# Patient Record
Sex: Male | Born: 1955 | Race: White | Hispanic: No | Marital: Married | Smoking: Never smoker
Health system: Southern US, Community
[De-identification: ages and names within clinical notes are randomized; demographics above are authoritative.]

## PROBLEM LIST (undated history)

## (undated) DIAGNOSIS — C4490 Unspecified malignant neoplasm of skin, unspecified: Secondary | ICD-10-CM

## (undated) DIAGNOSIS — D472 Monoclonal gammopathy: Secondary | ICD-10-CM

## (undated) DIAGNOSIS — R001 Bradycardia, unspecified: Secondary | ICD-10-CM

## (undated) DIAGNOSIS — J189 Pneumonia, unspecified organism: Secondary | ICD-10-CM

## (undated) DIAGNOSIS — E785 Hyperlipidemia, unspecified: Secondary | ICD-10-CM

## (undated) DIAGNOSIS — M1990 Unspecified osteoarthritis, unspecified site: Secondary | ICD-10-CM

## (undated) HISTORY — PX: WISDOM TOOTH EXTRACTION: SHX21

## (undated) HISTORY — PX: CHOLECYSTECTOMY: SHX55

## (undated) HISTORY — PX: ROTATOR CUFF REPAIR: SHX139

## (undated) HISTORY — PX: NASAL SEPTUM SURGERY: SHX37

## (undated) HISTORY — PX: MOHS SURGERY: SUR867

---

## 2012-04-27 ENCOUNTER — Emergency Department (HOSPITAL_BASED_OUTPATIENT_CLINIC_OR_DEPARTMENT_OTHER)
Admission: EM | Admit: 2012-04-27 | Discharge: 2012-04-27 | Disposition: A | Discharge status: Home / Self Care | Payer: BC Managed Care – PPO | Attending: Emergency Medicine | Admitting: Emergency Medicine

## 2012-04-27 ENCOUNTER — Encounter (HOSPITAL_BASED_OUTPATIENT_CLINIC_OR_DEPARTMENT_OTHER): Payer: Self-pay | Admitting: *Deleted

## 2012-04-27 DIAGNOSIS — H6690 Otitis media, unspecified, unspecified ear: Secondary | ICD-10-CM | POA: Insufficient documentation

## 2012-04-27 DIAGNOSIS — R52 Pain, unspecified: Secondary | ICD-10-CM | POA: Insufficient documentation

## 2012-04-27 DIAGNOSIS — H60399 Other infective otitis externa, unspecified ear: Secondary | ICD-10-CM | POA: Insufficient documentation

## 2012-04-27 DIAGNOSIS — R059 Cough, unspecified: Secondary | ICD-10-CM | POA: Insufficient documentation

## 2012-04-27 DIAGNOSIS — J069 Acute upper respiratory infection, unspecified: Secondary | ICD-10-CM

## 2012-04-27 DIAGNOSIS — H6090 Unspecified otitis externa, unspecified ear: Secondary | ICD-10-CM

## 2012-04-27 DIAGNOSIS — J029 Acute pharyngitis, unspecified: Secondary | ICD-10-CM | POA: Insufficient documentation

## 2012-04-27 DIAGNOSIS — R05 Cough: Secondary | ICD-10-CM | POA: Insufficient documentation

## 2012-04-27 DIAGNOSIS — Z7982 Long term (current) use of aspirin: Secondary | ICD-10-CM | POA: Insufficient documentation

## 2012-04-27 DIAGNOSIS — J3489 Other specified disorders of nose and nasal sinuses: Secondary | ICD-10-CM | POA: Insufficient documentation

## 2012-04-27 DIAGNOSIS — R6883 Chills (without fever): Secondary | ICD-10-CM | POA: Insufficient documentation

## 2012-04-27 DIAGNOSIS — Z79899 Other long term (current) drug therapy: Secondary | ICD-10-CM | POA: Insufficient documentation

## 2012-04-27 MED ORDER — OXYCODONE-ACETAMINOPHEN 5-325 MG PO TABS: 1.0000 | ORAL_TABLET | Freq: Four times a day (QID) | ORAL | Status: DC | PRN

## 2012-04-27 MED ORDER — AZITHROMYCIN 250 MG PO TABS
500.0000 mg | ORAL_TABLET | Freq: Once | ORAL | Status: AC
  Administered 2012-04-27: 500 mg via ORAL
  Filled 2012-04-27: qty 2

## 2012-04-27 MED ORDER — AZITHROMYCIN 250 MG PO TABS: 250.0000 mg | ORAL_TABLET | Freq: Every day | ORAL | Status: DC

## 2012-04-27 MED ORDER — CIPROFLOXACIN-DEXAMETHASONE 0.3-0.1 % OT SUSP
4.0000 [drp] | Freq: Two times a day (BID) | OTIC | Status: DC
  Administered 2012-04-27: 4 [drp] via OTIC
  Filled 2012-04-27: qty 7.5

## 2012-04-27 MED ORDER — OXYCODONE-ACETAMINOPHEN 5-325 MG PO TABS
2.0000 | ORAL_TABLET | Freq: Once | ORAL | Status: AC
  Administered 2012-04-27: 2 via ORAL
  Filled 2012-04-27 (×2): qty 2

## 2012-04-27 NOTE — ED Provider Notes (Signed)
History  This chart was scribed for Charles B. Bernette Mayers, MD by Shari Heritage, ED Scribe. The patient was seen in room MH01/MH01. Patient's care was started at 2138.   CSN: [id number]  Arrival date & time 04/27/12  1927   First MD Initiated Contact with Patient 04/27/12 2138      Chief Complaint  Patient presents with  . Otalgia    The history is provided by the patient. No language interpreter was used.    HPI Comments: John Thomas is a 57 y.o. male who presents to the Emergency Department complaining of sudden, moderate, constant, non-radiating right otalgia onset early this morning. There is associated chills and body aches. Patient has been traveling in [place] for the past several days. He states that pain began before his flight home to the [place] and persisted throughout. He states that on his connecting flight from [facility] to [place], he developed chills and body aches. Patient has been taking ibuprofen and states that it relieves pain significantly, but his last dosage was 6-8 hours ago. Patient also reports that he has had some cold symptoms for the past week including sore throat, cough and nasal congestion. He states that sore throat and congestion have improved, but cough has been persistent since Monday. Patient reports no significant past medical history.   History reviewed. No pertinent past medical history.  Past Surgical History  Procedure Laterality Date  . Cholecystectomy      History reviewed. No pertinent family history.  History  Substance Use Topics  . Smoking status: Never Smoker   . Smokeless tobacco: Not on file  . Alcohol Use: Yes      Review of Systems A complete 10 system review of systems was obtained and all systems are negative except as noted in the HPI and PMH.   Allergies  Penicillins  Home Medications   Current Outpatient Rx  Name  Route  Sig  Dispense  Refill  . aspirin 81 MG tablet   Oral   Take 81 mg by mouth daily.          Marland Kitchen atorvastatin (LIPITOR) 10 MG tablet   Oral   Take 10 mg by mouth daily.           Triage Vitals: BP 150/74  Pulse 84  Temp(Src) 98.9 F (37.2 C) (Oral)  Resp 20  Ht 6\' 3"  (1.905 m)  Wt 215 lb (97.523 kg)  BMI 26.87 kg/m2  SpO2 95%  Physical Exam  Nursing note and vitals reviewed. Constitutional: He is oriented to person, place, and time. He appears well-developed and well-nourished.  HENT:  Head: Normocephalic and atraumatic.  Moderate otitis externa on the left. No definite TM perforation.   Eyes: EOM are normal. Pupils are equal, round, and reactive to light.  Neck: Normal range of motion. Neck supple.  Cardiovascular: Normal rate, normal heart sounds and intact distal pulses.   Pulmonary/Chest: Effort normal and breath sounds normal.  Abdominal: Bowel sounds are normal. He exhibits no distension. There is no tenderness.  Musculoskeletal: Normal range of motion. He exhibits no edema and no tenderness.  Neurological: He is alert and oriented to person, place, and time. He has normal strength. No cranial nerve deficit or sensory deficit.  Skin: Skin is warm and dry. No rash noted.  Psychiatric: He has a normal mood and affect.    ED Course  Procedures (including critical care time) DIAGNOSTIC STUDIES: Oxygen Saturation is 95% on room air, adequate by my interpretation.  COORDINATION OF CARE: 9:45 PM- Patient informed of current plan for treatment and evaluation and agrees with plan at this time.      Labs Reviewed - No data to display No results found.   No diagnosis found.    MDM  Otitis externa with probable otitis media. Also having URI symptoms and febrile here. Will give topical and oral Abx. Pain meds and antipyretics at home as needed.       I personally performed the services described in this documentation, which was scribed in my presence. The recorded information has been reviewed and is accurate.     Charles B. Bernette Mayers,  MD 04/27/12 2157

## 2012-04-27 NOTE — ED Notes (Signed)
Right ear pain since this a.m. Hx cold s/s. Trying OTC meds with some relief. Recent hx of flying.

## 2018-02-11 ENCOUNTER — Encounter: Payer: Self-pay | Admitting: Family Medicine

## 2018-02-11 ENCOUNTER — Ambulatory Visit (INDEPENDENT_AMBULATORY_CARE_PROVIDER_SITE_OTHER): Payer: BLUE CROSS/BLUE SHIELD | Admitting: Family Medicine

## 2018-02-11 VITALS — BP 166/72 | HR 48 | Ht 75.0 in | Wt 210.0 lb

## 2018-02-11 DIAGNOSIS — M6788 Other specified disorders of synovium and tendon, other site: Secondary | ICD-10-CM | POA: Diagnosis not present

## 2018-02-11 DIAGNOSIS — M7701 Medial epicondylitis, right elbow: Secondary | ICD-10-CM

## 2018-02-11 MED ORDER — NITROGLYCERIN 0.2 MG/HR TD PT24: MEDICATED_PATCH | TRANSDERMAL | 1 refills | Status: DC

## 2018-02-11 NOTE — Progress Notes (Signed)
PCP: No primary care provider on file.  Subjective:   HPI: Patient is a 62 y.o. male here for left Achilles pain and right elbow pain.  Patient reports left Achilles pain for the past 3 to 4 years.  Is currently 0/10 but can be 7/10 at times and sharp.  He denies any specific mechanism of injury.  Prior to injury he was a regular runner which he has since stopped.  Currently he plays racquetball frequently.  He is tried multiple interventions to help his pain: - He was seen at [facility] regarding surgery.  Surgeries none performed - Physical therapy was completed earlier this year with minimal benefit; doing home exercises - Podiatry provided him with heel lifts and a steroid injection which did not help. - Chiropractor treated his cavus addressing trigger points which was limited in benefit - Ibuprofen and Voltaren gel has been temporarily helpful - He also uses ice and elevation - Currently his pain is worse in in the morning or with driving.  He describes an aching pain.  His pain is also worse following racquetball however his Achilles pain does not prevent him from playing  His right elbow pain has been present for 2 months.  It is currently 0/10.  Is over the medial aspect of the right elbow and is worse during weight lifting or while playing racquetball.  No past medical history on file.  Current Outpatient Medications on File Prior to Visit  Medication Sig Dispense Refill  . aspirin 81 MG tablet Take 81 mg by mouth daily.    Marland Kitchen atorvastatin (LIPITOR) 10 MG tablet Take 10 mg by mouth daily.    Marland Kitchen azithromycin (ZITHROMAX) 250 MG tablet Take 1 tablet (250 mg total) by mouth daily. 4 tablet 0  . oxyCODONE-acetaminophen (PERCOCET/ROXICET) 5-325 MG per tablet Take 1-2 tablets by mouth every 6 (six) hours as needed for pain. 20 tablet 0   No current facility-administered medications on file prior to visit.     Past Surgical History:  Procedure Laterality Date  .  CHOLECYSTECTOMY      Allergies  Allergen Reactions  . Penicillins Other (See Comments)    As child    Social History   Socioeconomic History  . Marital status: Married    Spouse name: Not on file  . Number of children: Not on file  . Years of education: Not on file  . Highest education level: Not on file  Occupational History  . Not on file  Social Needs  . Financial resource strain: Not on file  . Food insecurity:    Worry: Not on file    Inability: Not on file  . Transportation needs:    Medical: Not on file    Non-medical: Not on file  Tobacco Use  . Smoking status: Never Smoker  . Smokeless tobacco: Never Used  Substance and Sexual Activity  . Alcohol use: Yes  . Drug use: No  . Sexual activity: Not on file  Lifestyle  . Physical activity:    Days per week: Not on file    Minutes per session: Not on file  . Stress: Not on file  Relationships  . Social connections:    Talks on phone: Not on file    Gets together: Not on file    Attends religious service: Not on file    Active member of club or organization: Not on file    Attends meetings of clubs or organizations: Not on file  Relationship status: Not on file  . Intimate partner violence:    Fear of current or ex partner: Not on file    Emotionally abused: Not on file    Physically abused: Not on file    Forced sexual activity: Not on file  Other Topics Concern  . Not on file  Social History Narrative  . Not on file    No family history on file.  BP (!) 166/72   Pulse (!) 48   Ht 6\' 3"  (1.905 m)   Wt 210 lb (95.3 kg)   BMI 26.25 kg/m   Review of Systems: See HPI above.     Objective:  Physical Exam:  Gen: awake, alert, NAD, comfortable in exam room Pulm: breathing unlabored  Left ankle: - Inspection: No obvious deformity, erythema, swelling, or ecchymosis - Palpation: Tenderness at the insertion of the Achilles on the calcaneus - Strength: Normal strength with dorsiflexion,  plantarflexion, inversion, and eversion - ROM: Full ROM - Neuro/vasc: NV intact  Right elbow: No obvious deformity, erythema, swelling Mild tenderness over the medial epicondyle Full range of motion of the elbow and wrist 5/5 strength in wrist flexion extension.  Pain reproduced with resisted wrist flexion N/V intact distally    Assessment & Plan:  1.  Left Achilles tendinopathy/enthesopathy- patient has trialed multiple interventions at this point with minimal benefit.  He does reportedly have an appointment tomorrow to discuss PRP versus stem cell treatment. -We will start on nitroglycerin patches today -Heel lift -He will continue home exercises -We will follow up in 6 weeks.  2.  Medial epicondylitis -Voltaren gel -Home exercises -Consider counterforce brace -Follow-up in 6 weeks

## 2018-02-11 NOTE — Patient Instructions (Signed)
You have Achilles Tendinopathy Calf raises 3 sets of 10 on level ground once a day first. When these are easy, can do them one legged 3 sets of 10. Finally advance to doing them on a step. Can add heel walks, toe walks forward and backward as well Icing 15 minutes at a time 3-4 times a day as needed. Aleve or ibuprofen if needed. Avoid uneven ground, hills as much as possible. Heel lifts in shoes or shoes with a natural heel lift. Nitro patches 1/4th patch to affected area, change daily - can increase to 1/2 patch if you're not getting headaches. Consider period of immobilization, custom orthotics, PRP if not improving as expected. Follow up in 6 weeks.  You have medial epicondylitis (golfer's elbow) Avoid painful activities as much as possible (unless doing home exercises). Voltaren gel topically up to 4 times a day as needed. Strengthening with 1 pound weight pronation/supination, wrist flexion, stretching exercises (hold stretches 20-30 seconds and repeat 3 times, exercises 3 sets of 10 of each). Counterforce brace may be helpful to unload area of pain while it heals. Consider formal physical therapy, nitro patches, injection if not improving with home exercises. Follow up with me in 6 weeks.

## 2018-03-25 ENCOUNTER — Ambulatory Visit: Payer: BLUE CROSS/BLUE SHIELD | Admitting: Family Medicine

## 2018-07-26 ENCOUNTER — Other Ambulatory Visit: Payer: Self-pay | Admitting: Family Medicine

## 2019-06-24 ENCOUNTER — Ambulatory Visit
Admission: RE | Admit: 2019-06-24 | Discharge: 2019-06-24 | Disposition: A | Discharge status: Home / Self Care | Payer: BC Managed Care – PPO | Source: Ambulatory Visit | Attending: Family Medicine | Admitting: Family Medicine

## 2019-06-24 ENCOUNTER — Other Ambulatory Visit: Payer: Self-pay | Admitting: Family Medicine

## 2019-06-24 ENCOUNTER — Other Ambulatory Visit: Payer: Self-pay

## 2019-06-24 ENCOUNTER — Encounter: Payer: Self-pay | Admitting: Family Medicine

## 2019-06-24 ENCOUNTER — Ambulatory Visit (INDEPENDENT_AMBULATORY_CARE_PROVIDER_SITE_OTHER): Payer: BC Managed Care – PPO | Admitting: Family Medicine

## 2019-06-24 VITALS — BP 142/70 | Ht 75.0 in | Wt 205.0 lb

## 2019-06-24 DIAGNOSIS — M6788 Other specified disorders of synovium and tendon, other site: Secondary | ICD-10-CM

## 2019-06-24 DIAGNOSIS — M542 Cervicalgia: Secondary | ICD-10-CM

## 2019-06-24 MED ORDER — NITROGLYCERIN 0.2 MG/HR TD PT24: MEDICATED_PATCH | TRANSDERMAL | 1 refills | Status: DC

## 2019-06-24 NOTE — Progress Notes (Signed)
PCP: Jeni Salles, MD  Subjective:   HPI: Patient is a 64 y.o. male here for left achilles, left neck pain.  02/11/18: Patient reports left Achilles pain for the past 3 to 4 years.  Is currently 0/10 but can be 7/10 at times and sharp.  He denies any specific mechanism of injury.  Prior to injury he was a regular runner which he has since stopped.  Currently he plays racquetball frequently.  He is tried multiple interventions to help his pain: - He was seen at [facility] regarding surgery.  Surgeries none performed - Physical therapy was completed earlier this year with minimal benefit; doing home exercises - Podiatry provided him with heel lifts and a steroid injection which did not help. - Chiropractor treated his cavus addressing trigger points which was limited in benefit - Ibuprofen and Voltaren gel has been temporarily helpful - He also uses ice and elevation - Currently his pain is worse in in the morning or with driving.  He describes an aching pain.  His pain is also worse following racquetball however his Achilles pain does not prevent him from playing  06/24/19: Patient reports he has continued to have issues with his left achilles though pain in his right elbow, low back has improved. He reports since last visit he used nitro patches for about 1-2 months but didn't notice much benefit at 1/4th patch - unsure why he stopped it but denies headaches. He did have one injection of PRP for achilles. Has done PT, home exercise program and continues to do home exercises. Recalls pain was worse especially after playing racquetball for 2.5 hours on Saturday. Pain was a soreness and with burning.  Left neck pain has been going on for about a year. Feels deeper left side of mid-neck. Worse with flexion and turning to right side. No numbness or tingling. No radiation into arms. Has done home exercises and had treatment with chiro Johnston Ebbs.  History  reviewed. No pertinent past medical history.  Current Outpatient Medications on File Prior to Visit  Medication Sig Dispense Refill  . gabapentin (NEURONTIN) 300 MG capsule TK 2 CS PO BID  10  . pramipexole (MIRAPEX) 0.125 MG tablet TK 1 T PO QPM  0  . rosuvastatin (CRESTOR) 10 MG tablet Take 10 mg by mouth daily.    . diclofenac sodium (VOLTAREN) 1 % GEL APP 4 G TO BOTH FEET TID PRN  6  . zolpidem (AMBIEN) 10 MG tablet Take 5-10 mg by mouth at bedtime as needed.     No current facility-administered medications on file prior to visit.    Past Surgical History:  Procedure Laterality Date  . CHOLECYSTECTOMY      Allergies  Allergen Reactions  . Penicillins Other (See Comments)    As child    Social History   Socioeconomic History  . Marital status: Married    Spouse name: Not on file  . Number of children: Not on file  . Years of education: Not on file  . Highest education level: Not on file  Occupational History  . Not on file  Tobacco Use  . Smoking status: Never Smoker  . Smokeless tobacco: Never Used  Substance and Sexual Activity  . Alcohol use: Yes  . Drug use: No  . Sexual activity: Not on file  Other Topics Concern  . Not on file  Social History Narrative  . Not on file   Social Determinants of Health   Financial Resource  Strain:   . Difficulty of Paying Living Expenses:   Food Insecurity:   . Worried About Charity fundraiser in the Last Year:   . Arboriculturist in the Last Year:   Transportation Needs:   . Film/video editor (Medical):   Marland Kitchen Lack of Transportation (Non-Medical):   Physical Activity:   . Days of Exercise per Week:   . Minutes of Exercise per Session:   Stress:   . Feeling of Stress :   Social Connections:   . Frequency of Communication with Friends and Family:   . Frequency of Social Gatherings with Friends and Family:   . Attends Religious Services:   . Active Member of Clubs or Organizations:   . Attends Theatre manager Meetings:   Marland Kitchen Marital Status:   Intimate Partner Violence:   . Fear of Current or Ex-Partner:   . Emotionally Abused:   Marland Kitchen Physically Abused:   . Sexually Abused:     History reviewed. No pertinent family history.  BP (!) 142/70   Ht 6\' 3"  (1.905 m)   Wt 205 lb (93 kg)   BMI 25.62 kg/m   Review of Systems: See HPI above.     Objective:  Physical Exam:  Gen: NAD, comfortable in exam room  Neck: No gross deformity, swelling, bruising. No paraspinalTTP .  No midline/bony TTP. Extension only to 10 degrees.  Full flexion.  Lateral rotations to 30 degrees, more pain with right lateral rotation. BUE strength 5/5.   Sensation intact to light touch.   2+ equal reflexes in biceps, brachioradialis tendons.  1+ triceps. Negative spurlings. NV intact distal BUEs.  Left foot/ankle: No gross deformity, swelling, ecchymoses FROM with minimal pain (feels it) with single leg raises, plantarflexion TTP lateral achilles insertion at calcaneus.  No other tenderness. Negative ant drawer and talar tilt.   Negative syndesmotic compression. Negative calcaneal squeeze. Thompsons test negative. NV intact distally.   Assessment & Plan:  1. Neck pain - will obtain radiographs.  Suspect facet arthropathy vs deep paraspinal muscle strain.  Consider physical therapy.  2. Left achilles tendinopathy - insertional.  Limited MSK u/s today with disorganization of tendon at insertion with 0.65cm depth of tendon, anechoic regions within tendon but no neovascularity.  Will try 1/2 nitro patches.  Heel lifts when playing racquetball.  Encouraged repeating PRP as well.  F/u in 6 weeks.

## 2019-06-24 NOTE — Patient Instructions (Signed)
Get x-rays of your cervical spine after you leave today - this is due to facet arthritis vs deep paraspinal strain. We will call you with results and next steps.  Continue with your exercises for your achilles. Start nitro patches 1/2 patch to affected achilles, change daily. Wear heel lifts on both sides when playing racquetball to take some tension of the achilles. Consider repeating the PRP over 2 more sessions as well. Follow up with me in 6 weeks for both issues.

## 2019-06-25 ENCOUNTER — Encounter: Payer: Self-pay | Admitting: Family Medicine

## 2019-06-30 NOTE — Telephone Encounter (Signed)
Please make referral to [facility] in [facility] for cervical spine facet arthritis.  He's seen Shanon Brow before.  Thanks!

## 2019-07-03 ENCOUNTER — Ambulatory Visit: Payer: BLUE CROSS/BLUE SHIELD | Admitting: Sports Medicine

## 2019-08-13 ENCOUNTER — Other Ambulatory Visit: Payer: Self-pay

## 2019-08-13 ENCOUNTER — Ambulatory Visit: Payer: BC Managed Care – PPO | Admitting: Family Medicine

## 2019-08-13 ENCOUNTER — Encounter: Payer: Self-pay | Admitting: Family Medicine

## 2019-08-13 VITALS — BP 140/62 | Ht 75.0 in | Wt 207.0 lb

## 2019-08-13 DIAGNOSIS — M542 Cervicalgia: Secondary | ICD-10-CM

## 2019-08-13 DIAGNOSIS — M6788 Other specified disorders of synovium and tendon, other site: Secondary | ICD-10-CM

## 2019-08-13 NOTE — Progress Notes (Signed)
PCP: Jeni Salles, MD  Subjective:   HPI: Patient is a 64 y.o. male here for left achilles, left neck pain.  02/11/18: Patient reports left Achilles pain for the past 3 to 4 years.  Is currently 0/10 but can be 7/10 at times and sharp.  He denies any specific mechanism of injury.  Prior to injury he was a regular runner which he has since stopped.  Currently he plays racquetball frequently.  He is tried multiple interventions to help his pain: - He was seen at [facility] regarding surgery.  Surgeries none performed - Physical therapy was completed earlier this year with minimal benefit; doing home exercises - Podiatry provided him with heel lifts and a steroid injection which did not help. - Chiropractor treated his cavus addressing trigger points which was limited in benefit - Ibuprofen and Voltaren gel has been temporarily helpful - He also uses ice and elevation - Currently his pain is worse in in the morning or with driving.  He describes an aching pain.  His pain is also worse following racquetball however his Achilles pain does not prevent him from playing  06/24/19: Patient reports he has continued to have issues with his left achilles though pain in his right elbow, low back has improved. He reports since last visit he used nitro patches for about 1-2 months but didn't notice much benefit at 1/4th patch - unsure why he stopped it but denies headaches. He did have one injection of PRP for achilles. Has done PT, home exercise program and continues to do home exercises. Recalls pain was worse especially after playing racquetball for 2.5 hours on Saturday. Pain was a soreness and with burning.  Left neck pain has been going on for about a year. Feels deeper left side of mid-neck. Worse with flexion and turning to right side. No numbness or tingling. No radiation into arms. Has done home exercises and had treatment with chiro Johnston Ebbs.  6/2: Patient  reports he is doing well and improving. Able to play racquetball with some stiffness of achilles. Doing physical therapy with Shanon Brow at [facility] and home exercises regularly. Using a full nitro patch left achilles, started glucosamine for neck.. Some pain turning to left side within neck though feels good today. No radiation into extremities. No numbness, tingling.  History reviewed. No pertinent past medical history.  Current Outpatient Medications on File Prior to Visit  Medication Sig Dispense Refill  . diclofenac sodium (VOLTAREN) 1 % GEL APP 4 G TO BOTH FEET TID PRN  6  . gabapentin (NEURONTIN) 300 MG capsule TK 2 CS PO BID  10  . nitroGLYCERIN (NITRODUR - DOSED IN MG/24 HR) 0.2 mg/hr patch UNWRAP AND APPLY 1/2 PATCH TO AFFECTED ACHILLES, CHANGE DAILY 90 patch 0  . pramipexole (MIRAPEX) 0.125 MG tablet TK 1 T PO QPM  0  . rosuvastatin (CRESTOR) 10 MG tablet Take 10 mg by mouth daily.    Marland Kitchen zolpidem (AMBIEN) 10 MG tablet Take 5-10 mg by mouth at bedtime as needed.     No current facility-administered medications on file prior to visit.    Past Surgical History:  Procedure Laterality Date  . CHOLECYSTECTOMY      Allergies  Allergen Reactions  . Penicillins Other (See Comments)    As child    Social History   Socioeconomic History  . Marital status: Married    Spouse name: Not on file  . Number of children: Not on file  . Years of  education: Not on file  . Highest education level: Not on file  Occupational History  . Not on file  Tobacco Use  . Smoking status: Never Smoker  . Smokeless tobacco: Never Used  Substance and Sexual Activity  . Alcohol use: Yes  . Drug use: No  . Sexual activity: Not on file  Other Topics Concern  . Not on file  Social History Narrative  . Not on file   Social Determinants of Health   Financial Resource Strain:   . Difficulty of Paying Living Expenses:   Food Insecurity:   . Worried About Charity fundraiser in the Last Year:    . Arboriculturist in the Last Year:   Transportation Needs:   . Film/video editor (Medical):   Marland Kitchen Lack of Transportation (Non-Medical):   Physical Activity:   . Days of Exercise per Week:   . Minutes of Exercise per Session:   Stress:   . Feeling of Stress :   Social Connections:   . Frequency of Communication with Friends and Family:   . Frequency of Social Gatherings with Friends and Family:   . Attends Religious Services:   . Active Member of Clubs or Organizations:   . Attends Archivist Meetings:   Marland Kitchen Marital Status:   Intimate Partner Violence:   . Fear of Current or Ex-Partner:   . Emotionally Abused:   Marland Kitchen Physically Abused:   . Sexually Abused:     History reviewed. No pertinent family history.  BP 140/62   Ht 6\' 3"  (1.905 m)   Wt 207 lb (93.9 kg)   BMI 25.87 kg/m   Review of Systems: See HPI above.     Objective:  Physical Exam:  Gen: NAD, comfortable in exam room  Neck: No gross deformity, swelling, bruising. No paraspinal TTP .  No midline/bony TTP. Extension to 10 degrees, full lateral rotations and flexion. BUE strength 5/5. Sensation intact to light touch.   Negative spurlings. NV intact distal BUEs.  Left foot/ankle: No gross deformity, swelling, ecchymoses FROM without pain. No TTP NV intact distally.   Assessment & Plan:  1. Neck pain - noted facet arthropathy along with DDD but the latter is more on opposite right side of his primary pain.  Improved with physical therapy - continue with this and transition to home exercises.  2. Left achilles tendinopathy - insertional.  Excellent progress with full nitro patches, physical therapy.  Continue patches for 6 more weeks at least, PT with transition to home exercise program.  F/u in 6 weeks or prn.

## 2019-09-22 ENCOUNTER — Ambulatory Visit
Admission: RE | Admit: 2019-09-22 | Discharge: 2019-09-22 | Disposition: A | Discharge status: Home / Self Care | Payer: BC Managed Care – PPO | Source: Ambulatory Visit | Attending: Family Medicine | Admitting: Family Medicine

## 2019-09-22 ENCOUNTER — Encounter: Payer: Self-pay | Admitting: Family Medicine

## 2019-09-22 ENCOUNTER — Ambulatory Visit: Payer: BC Managed Care – PPO | Admitting: Family Medicine

## 2019-09-22 ENCOUNTER — Other Ambulatory Visit: Payer: Self-pay

## 2019-09-22 VITALS — BP 106/60 | Ht 75.0 in | Wt 207.0 lb

## 2019-09-22 DIAGNOSIS — M79644 Pain in right finger(s): Secondary | ICD-10-CM

## 2019-09-22 NOTE — Patient Instructions (Signed)
Get x-rays of this finger after you leave here - I'll call you with the results.  For your achilles consider a series of PRP as we discussed. Continue your home exercises. The nitro can be continued for 3 more months if you feel you're getting benefit from them - otherwise you can stop this. Shock wave therapy is another consideration. Follow up with me after you do the PRP series.

## 2019-09-22 NOTE — Progress Notes (Addendum)
  John Thomas - 64 y.o. male MRN [id number]  Date of birth: 05/02/1955    SUBJECTIVE:      Chief Complaint:/ HPI:  CC: left achilles tendon pain and right middle finger pain  John Thomas is a 64 year old gentleman presenting for follow up of his achilles tendinopathy and right middle finger pain.   Patient has been evaluated multiple times for his left achilles tendinopathy and has undergone multiple interventions including, PRP therapy and physical therapy, nitro patches. Today, he reports overall improvement and is able to play raquetball, golf, and walk/run. He denies any significant discomfort but does note tightness in the left achilles tendon following activities. He continues to perform physical therapy exercises at home. Not back at the level he would like to be.  Patient also notes right middle finger pain following a traumatic injury eight weeks ago. He notes that he was pressure washing his home when he fell off the ladder and hurt his right middle finger. He sustained superficial laceration to the finger but does not believe he broke anything. No imaging obtained at the time. He notes that he has some pain in the right middle finger that is worse in the morning and improves with activity. He is concerned about possibly developing arthritis.   ROS:     See HPI  PERTINENT  PMH / PSH FH / / SH:  Past Medical, Surgical, Social, and Family History Reviewed & Updated in the EMR.  Pertinent findings include:  No past medical history on file. No family history on file.  OBJECTIVE: BP 106/60   Ht 6\' 3"  (1.905 m)   Wt 207 lb (93.9 kg)   BMI 25.87 kg/m   Physical Exam:  Vital signs are reviewed.  GEN: Alert and oriented, NAD Pulm: Breathing unlabored PSY: normal mood, congruent affect  MSK: Left Foot: Inspection:  No obvious bony deformity.  No swelling, erythema, or bruising.  Normal arch Palpation: No significant tenderness to palpation ROM: Full  ROM of the ankle  with minimal tenderness at the insertion point of achilles tendon; Normal midfoot flexibility Strength: 5/5 strength ankle in all planes Neurovascular: N/V intact distally in the lower extremity Special tests:Normal calcaneal motion with heel raise  Right middle finger: Hand: Inspection: Mild swelling with deformity noted at the right 3rd PIP; no significant erythema or bruising noted.  No malrotation or angulation. Palpation: mild tenderness to palpation of the right 3rd finger PIP ROM: Full ROM of the digits and wrist Strength: 5/5 strength in the forearm, wrist and interosseus muscles including resisted extension and flexion at PIP and DIP joints. Neurovascular: NV intact   ASSESSMENT & PLAN:  1. Left achilles tendinopathy: Patient is mostly functional but still reports some discomfort with stretching and following exercise. Continuing with physical therapy. Discussed repeat PRP therapy vs ECSW therapy. Patient elected to do another course of PRP therapy at this time - will contact the clinic he used previously in [place].   2. Right middle finger pain: Possible fracture vs tendonopathy. Recommended for X-ray. Will call him with results - reports this is about 29 weeks old.

## 2019-09-24 ENCOUNTER — Ambulatory Visit: Payer: BC Managed Care – PPO | Admitting: Family Medicine

## 2021-03-04 IMAGING — CR DG FINGER MIDDLE 2+V*R*
3 series · 3 of 3 positions shown · non-contrast
Comparison: None.

CLINICAL DATA: Pain after fall from ladder

EXAM:
RIGHT THIRD FINGER 2+V

[x finger pa right]
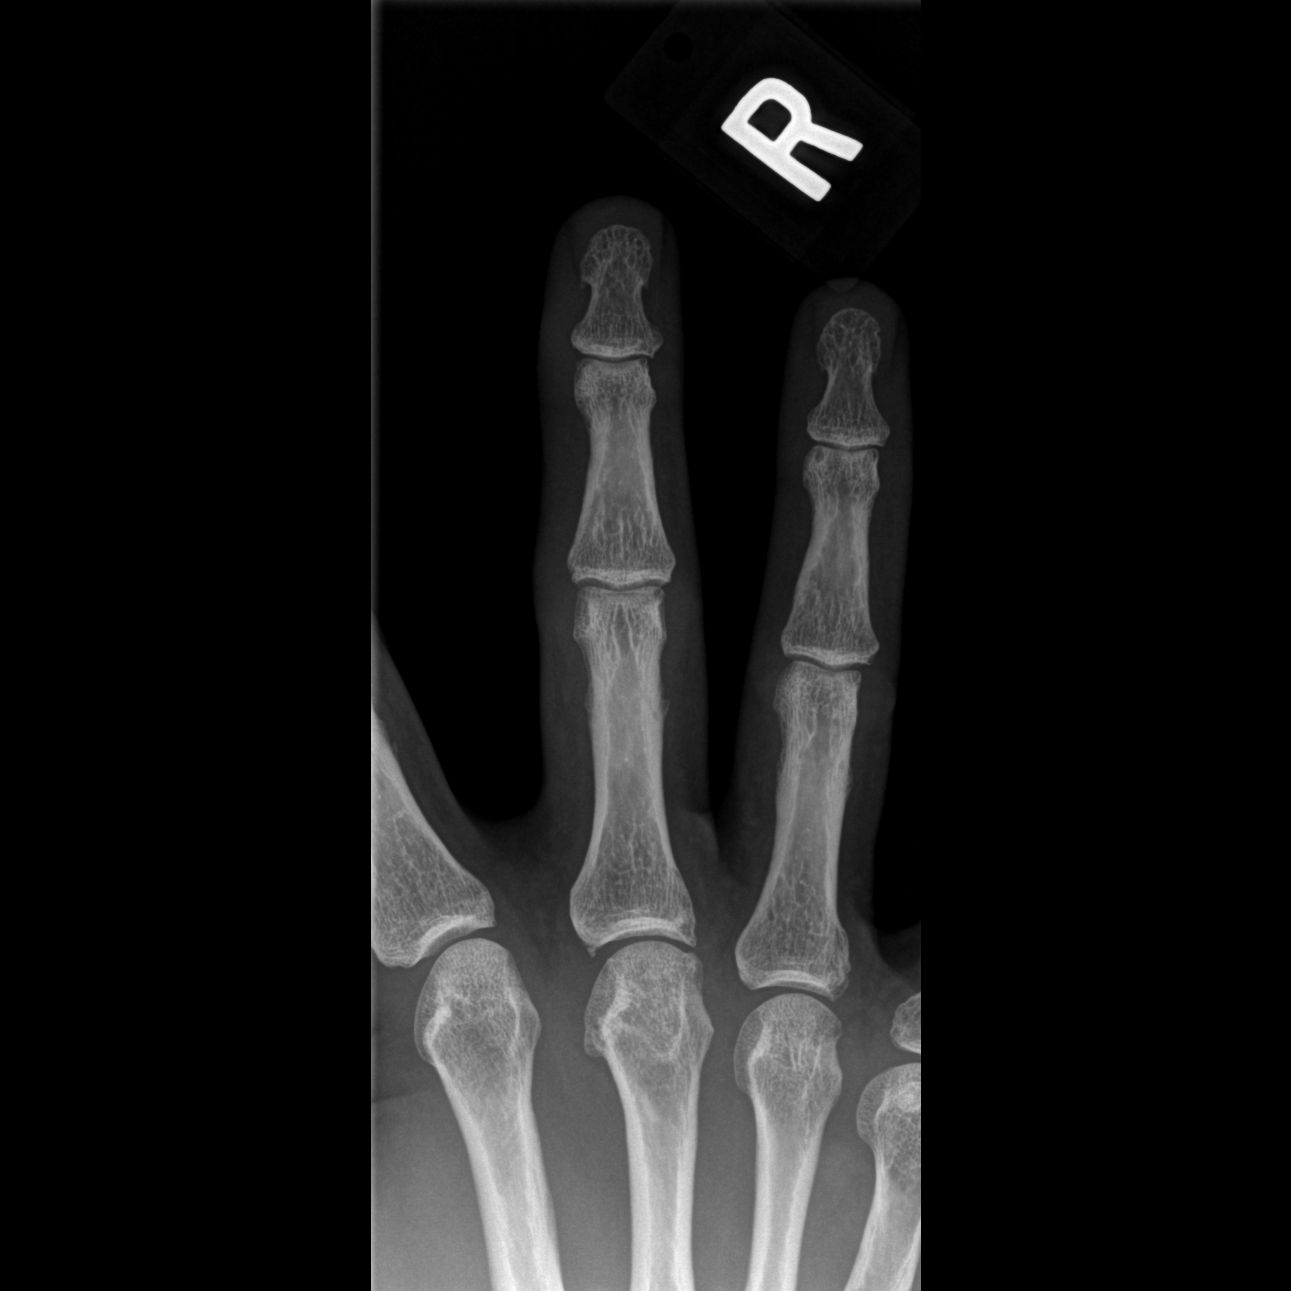

[x finger obl. right]
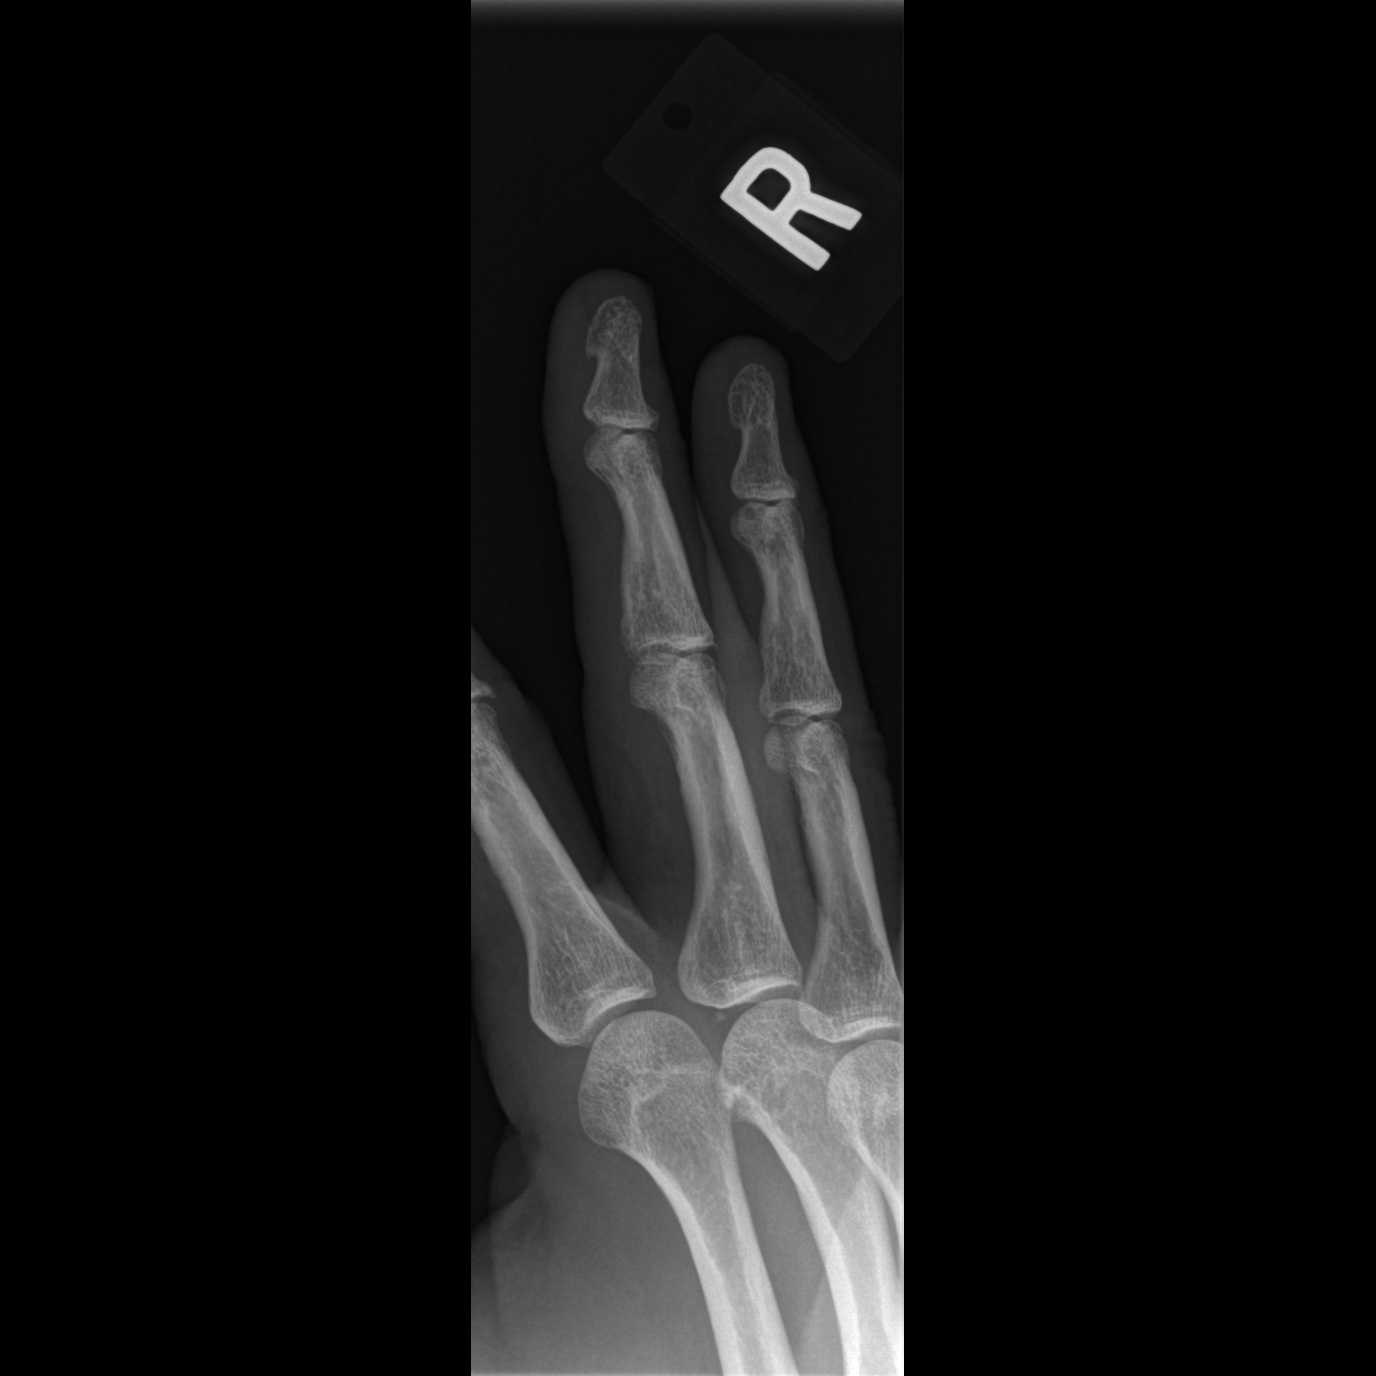

[x finger lateral right]
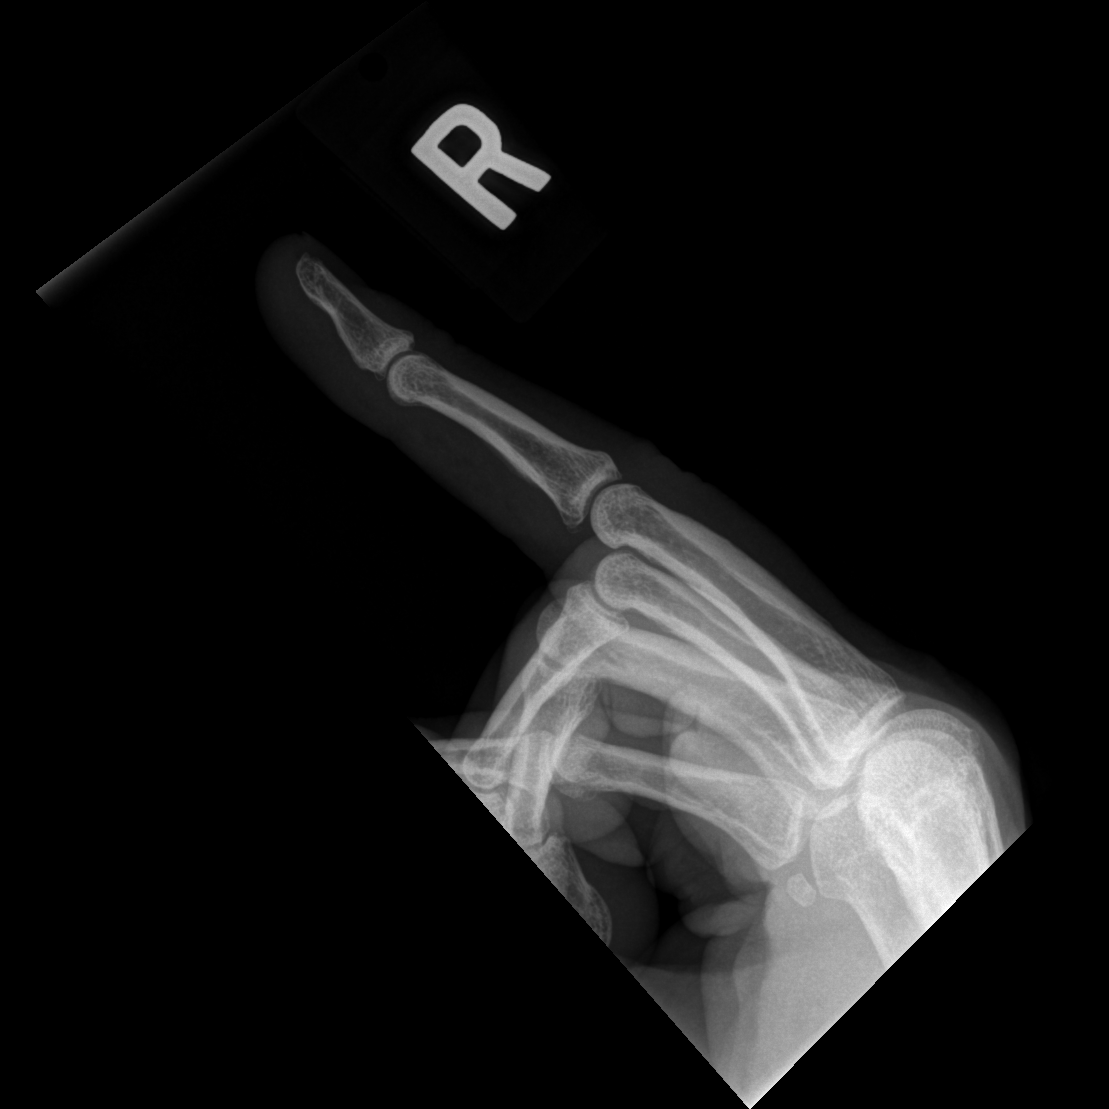

[3 of 3 positions shown; findings below may reference images not displayed]

FINDINGS: Frontal, oblique, and lateral views were obtained. There is a tiny
calcification in the lateral aspect of the third MCP joint,
concerning for small avulsion injury. No other evident fracture. No
dislocation. The joint spaces appear normal. No erosive change.
IMPRESSION: Questionable small avulsion seen in the lateral aspect of the third
MCP joint, likely arising from the proximal most aspect of the third
proximal phalanx. No other evident fracture. No dislocation. No
appreciable joint space narrowing or erosion.

These results will be called to the ordering clinician or
representative by the Radiologist Assistant, and communication
documented in the PACS or [REDACTED].

## 2021-12-21 ENCOUNTER — Encounter (HOSPITAL_COMMUNITY): Payer: Self-pay

## 2021-12-21 NOTE — Progress Notes (Addendum)
COVID Vaccine Completed:  Yes  Date of COVID positive in last 90 days:  No  PCP - Fredrich Romans, MD Cardiologist - Hoy Finlay, MD (last OV 2014 for bradycardia)  Chest x-ray - N/A EKG - 12-23-21 Epic Stress Test - 2014 ECHO - N/A Cardiac Cath - N/A Pacemaker/ICD device last checked: Spinal Cord Stimulator:N/A Holter Monitor - 2014  Bowel Prep - N/A  Sleep Study - N/A CPAP -   Fasting Blood Sugar - N/A Checks Blood Sugar _____ times a day  Blood Thinner Instructions:  N/A Aspirin Instructions: Last Dose:  Activity level:  Can go up a flight of stairs and perform activities of daily living without stopping and without symptoms of chest pain or shortness of breath.  Able to exercise without symptoms  Anesthesia review:  Bradycardia. Patient is very athletic  Patient denies shortness of breath, fever, cough and chest pain at PAT appointment  Patient verbalized understanding of instructions that were given to them at the PAT appointment. Patient was also instructed that they will need to review over the PAT instructions again at home before surgery.

## 2021-12-21 NOTE — Patient Instructions (Addendum)
SURGICAL WAITING ROOM VISITATION Patients having surgery or a procedure may have no more than 2 support people in the waiting area - these visitors may rotate.   Children under the age of 54 must have an adult with them who is not the patient. If the patient needs to stay at the hospital during part of their recovery, the visitor guidelines for inpatient rooms apply. Pre-op nurse will coordinate an appropriate time for 1 support person to accompany patient in pre-op.  This support person may not rotate.    Please refer to the [facility] website for the visitor guidelines for Inpatients (after your surgery is over and you are in a regular room).      Your procedure is scheduled on: 12-29-21   Report to [facility] Main Entrance    Report to admitting at 8:30 AM   Call this number if you have problems the morning of surgery [phone]   Do not eat food :After Midnight.   After Midnight you may have the following liquids until 8:00 AM DAY OF SURGERY  Water Non-Citrus Juices (without pulp, NO RED) Carbonated Beverages Black Coffee (NO MILK/CREAM OR CREAMERS, sugar ok)  Clear Tea (NO MILK/CREAM OR CREAMERS, sugar ok) regular and decaf                             Plain Jell-O (NO RED)                                           Fruit ices (not with fruit pulp, NO RED)                                     Popsicles (NO RED)                                                               Sports drinks like Gatorade (NO RED)                   The day of surgery:  Drink ONE (1) Pre-Surgery Clear Ensure at 8:00 AM the morning of surgery. Drink in one sitting. Do not sip.  This drink was given to you during your hospital  pre-op appointment visit. Nothing else to drink after completing the Pre-Surgery Clear Ensure .          If you have questions, please contact your surgeon's office.   FOLLOW  ANY ADDITIONAL PRE OP INSTRUCTIONS YOU RECEIVED FROM YOUR SURGEON'S OFFICE!!!      Oral Hygiene is also important to reduce your risk of infection.                                    Remember - BRUSH YOUR TEETH THE MORNING OF SURGERY WITH YOUR REGULAR TOOTHPASTE   Do NOT smoke after Midnight   Take these medicines the morning of surgery with A SIP OF WATER:   Gabapentin  Pramipexole  Rosuvastatin  You may not have any metal on your body including jewelry, and body piercing             Do not wear lotions, powders, cologne, or deodorant              Men may shave face and neck.   Do not bring valuables to the hospital. [place].   Contacts, dentures or bridgework may not be worn into surgery.   DO NOT [place]. PHARMACY WILL DISPENSE MEDICATIONS LISTED ON YOUR MEDICATION LIST TO YOU DURING YOUR ADMISSION [place]!    Patients discharged on the day of surgery will not be allowed to drive home.  Someone NEEDS to stay with you for the first 24 hours after anesthesia.   Special Instructions: Bring a copy of your healthcare power of attorney and living will documents the day of surgery if you haven't scanned them before.              Please read over the following fact sheets you were given: IF [place]  If you received a COVID test during your pre-op visit  it is requested that you wear a mask when out in public, stay away from anyone that may not be feeling well and notify your surgeon if you develop symptoms. If you test positive for Covid or have been in contact with anyone that has tested positive in the last 10 days please notify you surgeon.  [place]- Preparing for Total Shoulder Arthroplasty    Before surgery, you can play an important role. Because skin is not sterile, your skin needs to be as free of germs as possible. You can reduce the number of germs on your skin by using the  following products. Benzoyl Peroxide Gel Reduces the number of germs present on the skin Applied twice a day to shoulder area starting two days before surgery    ==================================================================  Please follow these instructions carefully:  BENZOYL PEROXIDE 5% GEL  Please do not use if you have an allergy to benzoyl peroxide.   If your skin becomes reddened/irritated stop using the benzoyl peroxide.  Starting two days before surgery, apply as follows: Apply benzoyl peroxide in the morning and at night. Apply after taking a shower. If you are not taking a shower clean entire shoulder front, back, and side along with the armpit with a clean wet washcloth.  Place a quarter-sized dollop on your shoulder and rub in thoroughly, making sure to cover the front, back, and side of your shoulder, along with the armpit.   2 days before ____ AM   ____ PM              1 day before ____ AM   ____ PM                         Do this twice a day for two days.  (Last application is the night before surgery, AFTER using the CHG soap as described below).  Do NOT apply benzoyl peroxide gel on the day of surgery.   [place] - Preparing for Surgery Before surgery, you can play an important role.  Because skin is not sterile, your skin needs to be as free of germs as possible.  You can reduce the number of germs on your skin by washing  with CHG (chlorahexidine gluconate) soap before surgery.  CHG is an antiseptic cleaner which kills germs and bonds with the skin to continue killing germs even after washing. Please DO NOT use if you have an allergy to CHG or antibacterial soaps.  If your skin becomes reddened/irritated stop using the CHG and inform your nurse when you arrive at Short Stay. Do not shave (including legs and underarms) for at least 48 hours prior to the first CHG shower.  You may shave your face/neck.  Please follow these instructions carefully:  1.  Shower with  CHG Soap the night before surgery and the  morning of surgery.  2.  If you choose to wash your hair, wash your hair first as usual with your normal  shampoo.  3.  After you shampoo, rinse your hair and body thoroughly to remove the shampoo.                             4.  Use CHG as you would any other liquid soap.  You can apply chg directly to the skin and wash.  Gently with a scrungie or clean washcloth.  5.  Apply the CHG Soap to your body ONLY FROM THE NECK DOWN.   Do   not use on face/ open                           Wound or open sores. Avoid contact with eyes, ears mouth and   genitals (private parts).                       Wash face,  Genitals (private parts) with your normal soap.             6.  Wash thoroughly, paying special attention to the area where your    surgery  will be performed.  7.  Thoroughly rinse your body with warm water from the neck down.  8.  DO NOT shower/wash with your normal soap after using and rinsing off the CHG Soap.                9.  Pat yourself dry with a clean towel.            10.  Wear clean pajamas.            11.  Place clean sheets on your bed the night of your first shower and do not  sleep with pets. Day of Surgery : Do not apply any lotions/deodorants the morning of surgery.  Please wear clean clothes to the hospital/surgery center.  FAILURE TO FOLLOW THESE INSTRUCTIONS MAY RESULT IN THE CANCELLATION OF YOUR SURGERY  PATIENT SIGNATURE_________________________________  NURSE SIGNATURE__________________________________  ________________________________________________________________________     Adam [place]  An incentive spirometer is a tool that can help keep your lungs clear and active. This tool measures how well you are filling your lungs with each breath. Taking long deep breaths may help reverse or decrease the chance of developing breathing (pulmonary) problems (especially infection) following: A long period of time when you  are unable to move or be active. BEFORE THE PROCEDURE  If the spirometer includes an indicator to show your best effort, your nurse or respiratory therapist will set it to a desired goal. If possible, sit up straight or lean slightly forward. Try not to slouch. Hold the incentive spirometer in an upright  position. INSTRUCTIONS FOR USE  Sit on the edge of your bed if possible, or sit up as far as you can in bed or on a chair. Hold the incentive spirometer in an upright position. Breathe out normally. Place the mouthpiece in your mouth and seal your lips tightly around it. Breathe in slowly and as deeply as possible, raising the piston or the ball toward the top of the column. Hold your breath for 3-5 seconds or for as long as possible. Allow the piston or ball to fall to the bottom of the column. Remove the mouthpiece from your mouth and breathe out normally. Rest for a few seconds and repeat Steps 1 through 7 at least 10 times every 1-2 hours when you are awake. Take your time and take a few normal breaths between deep breaths. The spirometer may include an indicator to show your best effort. Use the indicator as a goal to work toward during each repetition. After each set of 10 deep breaths, practice coughing to be sure your lungs are clear. If you have an incision (the cut made at the time of surgery), support your incision when coughing by placing a pillow or rolled up towels firmly against it. Once you are able to get out of bed, walk around indoors and cough well. You may stop using the incentive spirometer when instructed by your caregiver.  RISKS AND COMPLICATIONS Take your time so you do not get dizzy or light-headed. If you are in pain, you may need to take or ask for pain medication before doing incentive spirometry. It is harder to take a deep breath if you are having pain. AFTER USE Rest and breathe slowly and easily. It can be helpful to keep track of a log of your progress. Your  caregiver can provide you with a simple table to help with this. If you are using the spirometer at home, follow these instructions: [place] IF:  You are having difficultly using the spirometer. You have trouble using the spirometer as often as instructed. Your pain medication is not giving enough relief while using the spirometer. You develop fever of 100.5 F (38.1 C) or higher. SEEK IMMEDIATE MEDICAL CARE IF:  You cough up bloody sputum that had not been present before. You develop fever of 102 F (38.9 C) or greater. You develop worsening pain at or near the incision site. MAKE SURE YOU:  Understand these instructions. Will watch your condition. Will get help right away if you are not doing well or get worse. Document Released: 07/10/2006 Document Revised: 05/22/2011 Document Reviewed: 09/10/2006 [facility] Patient Information 2014 [place], [place].   ________________________________________________________________________

## 2021-12-23 ENCOUNTER — Encounter (HOSPITAL_COMMUNITY): Payer: Self-pay

## 2021-12-23 ENCOUNTER — Other Ambulatory Visit: Payer: Self-pay

## 2021-12-23 ENCOUNTER — Encounter (HOSPITAL_COMMUNITY)
Admission: RE | Admit: 2021-12-23 | Discharge: 2021-12-23 | Disposition: A | Discharge status: Home / Self Care | Payer: BC Managed Care – PPO | Source: Ambulatory Visit | Attending: Orthopedic Surgery | Admitting: Orthopedic Surgery

## 2021-12-23 VITALS — BP 139/72 | HR 52 | Temp 98.9°F | Resp 14 | Ht 75.0 in | Wt 222.2 lb

## 2021-12-23 DIAGNOSIS — Z01818 Encounter for other preprocedural examination: Secondary | ICD-10-CM | POA: Insufficient documentation

## 2021-12-23 DIAGNOSIS — I2510 Atherosclerotic heart disease of native coronary artery without angina pectoris: Secondary | ICD-10-CM | POA: Insufficient documentation

## 2021-12-23 HISTORY — DX: Hyperlipidemia, unspecified: E78.5

## 2021-12-23 HISTORY — DX: Bradycardia, unspecified: R00.1

## 2021-12-23 HISTORY — DX: Unspecified malignant neoplasm of skin, unspecified: C44.90

## 2021-12-23 HISTORY — DX: Pneumonia, unspecified organism: J18.9

## 2021-12-23 HISTORY — DX: Unspecified osteoarthritis, unspecified site: M19.90

## 2021-12-23 HISTORY — DX: Monoclonal gammopathy: D47.2

## 2021-12-23 LAB — CBC
HCT: 42.6 % (ref 39.0–52.0)
Hemoglobin: 15 g/dL (ref 13.0–17.0)
MCH: 32.8 pg (ref 26.0–34.0)
MCHC: 35.2 g/dL (ref 30.0–36.0)
MCV: 93.2 fL (ref 80.0–100.0)
Platelets: 183 10*3/uL (ref 150–400)
RBC: 4.57 MIL/uL (ref 4.22–5.81)
RDW: 11.9 % (ref 11.5–15.5)
WBC: 3.3 10*3/uL — ABNORMAL LOW (ref 4.0–10.5)
nRBC: 0 % (ref 0.0–0.2)

## 2021-12-23 LAB — BASIC METABOLIC PANEL
Anion gap: 6 (ref 5–15)
BUN: 24 mg/dL — ABNORMAL HIGH (ref 8–23)
CO2: 28 mmol/L (ref 22–32)
Calcium: 9.4 mg/dL (ref 8.9–10.3)
Chloride: 102 mmol/L (ref 98–111)
Creatinine, Ser: 1.23 mg/dL (ref 0.61–1.24)
GFR, Estimated: >60 mL/min (ref >60)
Glucose, Bld: 111 mg/dL — ABNORMAL HIGH (ref 70–99)
Potassium: 4.7 mmol/L (ref 3.5–5.1)
Sodium: 136 mmol/L (ref 135–145)

## 2021-12-23 LAB — SURGICAL PCR SCREEN
MRSA, PCR: NEGATIVE
Staphylococcus aureus: NEGATIVE

## 2021-12-29 ENCOUNTER — Encounter (HOSPITAL_COMMUNITY)
Admission: RE | Disposition: A | Discharge status: Home / Self Care | Payer: Self-pay | Source: Home / Self Care | Attending: Orthopedic Surgery

## 2021-12-29 ENCOUNTER — Ambulatory Visit (HOSPITAL_COMMUNITY): Payer: BC Managed Care – PPO | Admitting: Anesthesiology

## 2021-12-29 ENCOUNTER — Encounter (HOSPITAL_COMMUNITY): Payer: Self-pay | Admitting: Orthopedic Surgery

## 2021-12-29 ENCOUNTER — Other Ambulatory Visit: Payer: Self-pay

## 2021-12-29 ENCOUNTER — Ambulatory Visit (HOSPITAL_COMMUNITY): Payer: BC Managed Care – PPO | Admitting: Physician Assistant

## 2021-12-29 ENCOUNTER — Ambulatory Visit (HOSPITAL_COMMUNITY)
Admission: RE | Admit: 2021-12-29 | Discharge: 2021-12-29 | Disposition: A | Discharge status: Home / Self Care | Payer: BC Managed Care – PPO | Attending: Orthopedic Surgery | Admitting: Orthopedic Surgery

## 2021-12-29 DIAGNOSIS — M19011 Primary osteoarthritis, right shoulder: Secondary | ICD-10-CM | POA: Diagnosis present

## 2021-12-29 DIAGNOSIS — M6281 Muscle weakness (generalized): Secondary | ICD-10-CM | POA: Insufficient documentation

## 2021-12-29 HISTORY — PX: TOTAL SHOULDER ARTHROPLASTY: SHX126

## 2021-12-29 SURGERY — ARTHROPLASTY, SHOULDER, TOTAL
Anesthesia: Regional | Site: Shoulder | Laterality: Right

## 2021-12-29 MED ORDER — ORAL CARE MOUTH RINSE: 15.0000 mL | Freq: Once | OROMUCOSAL | Status: AC

## 2021-12-29 MED ORDER — LIDOCAINE HCL (PF) 2 % IJ SOLN
INTRAMUSCULAR | Status: AC
  Filled 2021-12-29: qty 5

## 2021-12-29 MED ORDER — TRANEXAMIC ACID-NACL 1000-0.7 MG/100ML-% IV SOLN
1000.0000 mg | INTRAVENOUS | Status: AC
  Administered 2021-12-29: 1000 mg via INTRAVENOUS
  Filled 2021-12-29: qty 100

## 2021-12-29 MED ORDER — ONDANSETRON HCL 4 MG/2ML IJ SOLN: 4.0000 mg | Freq: Once | INTRAMUSCULAR | Status: DC | PRN

## 2021-12-29 MED ORDER — BUPIVACAINE HCL (PF) 0.5 % IJ SOLN
INTRAMUSCULAR | Status: DC | PRN
  Administered 2021-12-29: 15 mL via PERINEURAL

## 2021-12-29 MED ORDER — OXYCODONE HCL 5 MG PO TABS
5.0000 mg | ORAL_TABLET | Freq: Once | ORAL | Status: AC | PRN
  Administered 2021-12-29: 5 mg via ORAL

## 2021-12-29 MED ORDER — MEPERIDINE HCL 50 MG/ML IJ SOLN: 6.2500 mg | INTRAMUSCULAR | Status: DC | PRN

## 2021-12-29 MED ORDER — LACTATED RINGERS IV BOLUS
500.0000 mL | Freq: Once | INTRAVENOUS | Status: AC
  Administered 2021-12-29: 500 mL via INTRAVENOUS

## 2021-12-29 MED ORDER — MIDAZOLAM HCL 2 MG/2ML IJ SOLN
1.0000 mg | INTRAMUSCULAR | Status: DC
  Administered 2021-12-29: 2 mg via INTRAVENOUS
  Filled 2021-12-29: qty 2

## 2021-12-29 MED ORDER — OXYCODONE HCL 5 MG PO TABS
ORAL_TABLET | ORAL | Status: AC
  Filled 2021-12-29: qty 1

## 2021-12-29 MED ORDER — STERILE WATER FOR IRRIGATION IR SOLN
Status: DC | PRN
  Administered 2021-12-29: 1000 mL

## 2021-12-29 MED ORDER — LIDOCAINE 2% (20 MG/ML) 5 ML SYRINGE
INTRAMUSCULAR | Status: DC | PRN
  Administered 2021-12-29: 40 mg via INTRAVENOUS

## 2021-12-29 MED ORDER — PHENYLEPHRINE 80 MCG/ML (10ML) SYRINGE FOR IV PUSH (FOR BLOOD PRESSURE SUPPORT): PREFILLED_SYRINGE | INTRAVENOUS | Status: DC | PRN

## 2021-12-29 MED ORDER — FENTANYL CITRATE (PF) 100 MCG/2ML IJ SOLN
INTRAMUSCULAR | Status: AC
  Filled 2021-12-29: qty 2

## 2021-12-29 MED ORDER — 0.9 % SODIUM CHLORIDE (POUR BTL) OPTIME
TOPICAL | Status: DC | PRN
  Administered 2021-12-29: 1000 mL

## 2021-12-29 MED ORDER — FENTANYL CITRATE (PF) 100 MCG/2ML IJ SOLN
INTRAMUSCULAR | Status: DC | PRN
  Administered 2021-12-29: 50 ug via INTRAVENOUS
  Administered 2021-12-29 (×2): 25 ug via INTRAVENOUS

## 2021-12-29 MED ORDER — EPHEDRINE SULFATE-NACL 50-0.9 MG/10ML-% IV SOSY
PREFILLED_SYRINGE | INTRAVENOUS | Status: DC | PRN
  Administered 2021-12-29 (×2): 5 mg via INTRAVENOUS

## 2021-12-29 MED ORDER — VANCOMYCIN HCL 1000 MG IV SOLR
INTRAVENOUS | Status: DC | PRN
  Administered 2021-12-29: 1000 mg via TOPICAL

## 2021-12-29 MED ORDER — ROCURONIUM BROMIDE 10 MG/ML (PF) SYRINGE
PREFILLED_SYRINGE | INTRAVENOUS | Status: DC | PRN
  Administered 2021-12-29: 20 mg via INTRAVENOUS
  Administered 2021-12-29: 60 mg via INTRAVENOUS
  Administered 2021-12-29: 20 mg via INTRAVENOUS

## 2021-12-29 MED ORDER — OXYCODONE-ACETAMINOPHEN 5-325 MG PO TABS: 1.0000 | ORAL_TABLET | ORAL | 0 refills | Status: AC | PRN

## 2021-12-29 MED ORDER — NAPROXEN 500 MG PO TABS: 500.0000 mg | ORAL_TABLET | Freq: Two times a day (BID) | ORAL | 1 refills | Status: AC

## 2021-12-29 MED ORDER — OXYCODONE HCL 5 MG/5ML PO SOLN: 5.0000 mg | Freq: Once | ORAL | Status: AC | PRN

## 2021-12-29 MED ORDER — LACTATED RINGERS IV BOLUS: 250.0000 mL | Freq: Once | INTRAVENOUS | Status: DC

## 2021-12-29 MED ORDER — CHLORHEXIDINE GLUCONATE 0.12 % MT SOLN
15.0000 mL | Freq: Once | OROMUCOSAL | Status: AC
  Administered 2021-12-29: 15 mL via OROMUCOSAL

## 2021-12-29 MED ORDER — LACTATED RINGERS IV SOLN: INTRAVENOUS | Status: DC

## 2021-12-29 MED ORDER — ONDANSETRON HCL 4 MG/2ML IJ SOLN
INTRAMUSCULAR | Status: AC
  Filled 2021-12-29: qty 2

## 2021-12-29 MED ORDER — PROPOFOL 10 MG/ML IV BOLUS
INTRAVENOUS | Status: AC
  Filled 2021-12-29: qty 20

## 2021-12-29 MED ORDER — ONDANSETRON HCL 4 MG PO TABS: 4.0000 mg | ORAL_TABLET | Freq: Three times a day (TID) | ORAL | 0 refills | Status: AC | PRN

## 2021-12-29 MED ORDER — ACETAMINOPHEN 325 MG PO TABS: 325.0000 mg | ORAL_TABLET | ORAL | Status: DC | PRN

## 2021-12-29 MED ORDER — DEXAMETHASONE SODIUM PHOSPHATE 10 MG/ML IJ SOLN
INTRAMUSCULAR | Status: DC | PRN
  Administered 2021-12-29: 10 mg via INTRAVENOUS

## 2021-12-29 MED ORDER — DEXAMETHASONE SODIUM PHOSPHATE 10 MG/ML IJ SOLN
INTRAMUSCULAR | Status: AC
  Filled 2021-12-29: qty 1

## 2021-12-29 MED ORDER — BUPIVACAINE LIPOSOME 1.3 % IJ SUSP
INTRAMUSCULAR | Status: DC | PRN
  Administered 2021-12-29: 10 mL via PERINEURAL

## 2021-12-29 MED ORDER — FENTANYL CITRATE PF 50 MCG/ML IJ SOSY: 25.0000 ug | PREFILLED_SYRINGE | INTRAMUSCULAR | Status: DC | PRN

## 2021-12-29 MED ORDER — GLYCOPYRROLATE 0.2 MG/ML IJ SOLN
INTRAMUSCULAR | Status: DC | PRN
  Administered 2021-12-29: .2 mg via INTRAVENOUS

## 2021-12-29 MED ORDER — PROPOFOL 10 MG/ML IV BOLUS
INTRAVENOUS | Status: DC | PRN
  Administered 2021-12-29: 180 mg via INTRAVENOUS

## 2021-12-29 MED ORDER — ONDANSETRON HCL 4 MG/2ML IJ SOLN
INTRAMUSCULAR | Status: DC | PRN
  Administered 2021-12-29: 4 mg via INTRAVENOUS

## 2021-12-29 MED ORDER — CYCLOBENZAPRINE HCL 10 MG PO TABS: 10.0000 mg | ORAL_TABLET | Freq: Three times a day (TID) | ORAL | 1 refills | Status: AC | PRN

## 2021-12-29 MED ORDER — CEFAZOLIN SODIUM-DEXTROSE 2-4 GM/100ML-% IV SOLN
2.0000 g | INTRAVENOUS | Status: AC
  Administered 2021-12-29: 2 g via INTRAVENOUS
  Filled 2021-12-29: qty 100

## 2021-12-29 MED ORDER — ROCURONIUM BROMIDE 10 MG/ML (PF) SYRINGE
PREFILLED_SYRINGE | INTRAVENOUS | Status: AC
  Filled 2021-12-29: qty 10

## 2021-12-29 MED ORDER — ACETAMINOPHEN 160 MG/5ML PO SOLN: 325.0000 mg | ORAL | Status: DC | PRN

## 2021-12-29 MED ORDER — FENTANYL CITRATE PF 50 MCG/ML IJ SOSY
50.0000 ug | PREFILLED_SYRINGE | INTRAMUSCULAR | Status: DC
  Administered 2021-12-29: 100 ug via INTRAVENOUS
  Filled 2021-12-29: qty 2

## 2021-12-29 MED ORDER — PHENYLEPHRINE HCL-NACL 20-0.9 MG/250ML-% IV SOLN
INTRAVENOUS | Status: DC | PRN
  Administered 2021-12-29: 35 ug/min via INTRAVENOUS

## 2021-12-29 SURGICAL SUPPLY — 69 items
ADH SKN CLS APL DERMABOND .7 (GAUZE/BANDAGES/DRESSINGS) ×1
AID PSTN UNV HD RSTRNT DISP (MISCELLANEOUS) ×1
BAG COUNTER SPONGE SURGICOUNT (BAG) IMPLANT
BAG SPEC THK2 15X12 ZIP CLS (MISCELLANEOUS) ×1
BAG SPNG CNTER NS LX DISP (BAG)
BAG ZIPLOCK 12X15 (MISCELLANEOUS) ×2 IMPLANT
BLADE SAW SGTL 83.5X18.5 (BLADE) ×2 IMPLANT
BODY TRUNION ECLIPSE 45 SL (Shoulder) IMPLANT
CEMENT BONE DEPUY (Cement) ×2 IMPLANT
COOLER ICEMAN CLASSIC (MISCELLANEOUS) ×2 IMPLANT
COVER BACK TABLE 60X90IN (DRAPES) ×2 IMPLANT
COVER SURGICAL LIGHT HANDLE (MISCELLANEOUS) ×2 IMPLANT
DERMABOND ADVANCED .7 DNX12 (GAUZE/BANDAGES/DRESSINGS) ×2 IMPLANT
DRAPE ORTHO SPLIT 77X108 STRL (DRAPES) ×2
DRAPE SHEET LG 3/4 BI-LAMINATE (DRAPES) ×2 IMPLANT
DRAPE SURG 17X11 SM STRL (DRAPES) ×2 IMPLANT
DRAPE SURG ORHT 6 SPLT 77X108 (DRAPES) ×4 IMPLANT
DRAPE TOP 10253 STERILE (DRAPES) ×2 IMPLANT
DRAPE U-SHAPE 47X51 STRL (DRAPES) ×2 IMPLANT
DRSG AQUACEL AG ADV 3.5X 6 (GAUZE/BANDAGES/DRESSINGS) IMPLANT
DRSG AQUACEL AG ADV 3.5X10 (GAUZE/BANDAGES/DRESSINGS) ×2 IMPLANT
DURAPREP 26ML APPLICATOR (WOUND CARE) ×2 IMPLANT
ELECT BLADE TIP CTD 4 INCH (ELECTRODE) ×2 IMPLANT
ELECT PENCIL ROCKER SW 15FT (MISCELLANEOUS) ×2 IMPLANT
ELECT REM PT RETURN 15FT ADLT (MISCELLANEOUS) ×2 IMPLANT
FACESHIELD WRAPAROUND (MASK) ×4 IMPLANT
FACESHIELD WRAPAROUND OR TEAM (MASK) ×8 IMPLANT
GLENOID WITH CLEAT MEDIUM (Shoulder) IMPLANT
GLOVE BIO SURGEON STRL SZ7.5 (GLOVE) ×2 IMPLANT
GLOVE BIO SURGEON STRL SZ8 (GLOVE) ×2 IMPLANT
GLOVE SS BIOGEL STRL SZ 7 (GLOVE) ×2 IMPLANT
GLOVE SS BIOGEL STRL SZ 7.5 (GLOVE) ×2 IMPLANT
GOWN STRL SURGICAL XL XLNG (GOWN DISPOSABLE) ×4 IMPLANT
HEAD HUMERAL ECLIPSE 45/17 (Shoulder) IMPLANT
IMPL ECLIPSE SPEEDCAP (Shoulder) IMPLANT
IMPLANT ECLIPSE SPEEDCAP (Shoulder) ×1 IMPLANT
KIT BASIN OR (CUSTOM PROCEDURE TRAY) ×2 IMPLANT
KIT TURNOVER KIT A (KITS) IMPLANT
MANIFOLD NEPTUNE II (INSTRUMENTS) ×2 IMPLANT
NDL TAPERED W/ NITINOL LOOP (MISCELLANEOUS) IMPLANT
NEEDLE TAPERED W/ NITINOL LOOP (MISCELLANEOUS) IMPLANT
NS IRRIG 1000ML POUR BTL (IV SOLUTION) ×2 IMPLANT
PACK SHOULDER (CUSTOM PROCEDURE TRAY) ×2 IMPLANT
PAD COLD SHLDR WRAP-ON (PAD) ×2 IMPLANT
PIN NITINOL TARGETER 2.8 (PIN) IMPLANT
PIN SET MODULAR GLENOID SYSTEM (PIN) IMPLANT
PROTECTOR NERVE ULNAR (MISCELLANEOUS) ×2 IMPLANT
RESTRAINT HEAD UNIVERSAL NS (MISCELLANEOUS) ×2 IMPLANT
SCREW SPINAL 40 F/CAGE LRG (Screw) IMPLANT
SIZER ECLIPSE CAGE SCREW (ORTHOPEDIC DISPOSABLE SUPPLIES) IMPLANT
SLING ARM FOAM STRAP LRG (SOFTGOODS) IMPLANT
SLING ARM FOAM STRAP MED (SOFTGOODS) IMPLANT
SMARTMIX MINI TOWER (MISCELLANEOUS)
SPONGE T-LAP 4X18 ~~LOC~~+RFID (SPONGE) IMPLANT
SUCTION FRAZIER HANDLE 12FR (TUBING) ×1
SUCTION TUBE FRAZIER 12FR DISP (TUBING) ×2 IMPLANT
SUT FIBERWIRE #2 38 T-5 BLUE (SUTURE)
SUT MNCRL AB 3-0 PS2 18 (SUTURE) ×2 IMPLANT
SUT MON AB 2-0 CT1 36 (SUTURE) ×2 IMPLANT
SUT VIC AB 1 CT1 36 (SUTURE) ×2 IMPLANT
SUTURE FIBERWR #2 38 T-5 BLUE (SUTURE) IMPLANT
SUTURE TAPE 1.3 40 TPR END (SUTURE) IMPLANT
SUTURETAPE 1.3 40 TPR END (SUTURE)
TOWEL OR 17X26 10 PK STRL BLUE (TOWEL DISPOSABLE) ×2 IMPLANT
TOWEL OR NON WOVEN STRL DISP B (DISPOSABLE) ×2 IMPLANT
TOWER SMARTMIX MINI (MISCELLANEOUS) IMPLANT
TUBE SUCTION HIGH CAP CLEAR NV (SUCTIONS) ×2 IMPLANT
WATER STERILE IRR 1000ML POUR (IV SOLUTION) ×4 IMPLANT
YANKAUER SUCT BULB TIP 10FT TU (MISCELLANEOUS) ×2 IMPLANT

## 2021-12-29 NOTE — Evaluation (Signed)
Occupational Therapy Evaluation Patient Details Name: John Thomas MRN: [id number] DOB: [birth year] Today's Date: 12/29/2021   History of Present Illness 66 y/o male s/p R total shoulder arthroplasty   Clinical Impression   John Thomas is a 66 y/o male s/p shoulder replacement without functional use of right dominant upper extremity secondary to effects of surgery and interscalene block and shoulder precautions. Therapist provided education and instruction to patient and spouse in regards to exercises, precautions, positioning, donning upper extremity clothing and bathing while maintaining shoulder precautions, ice and edema management and donning/doffing sling. Patient and spouse verbalized understanding and demonstrated as needed. Patient needed assistance to donn jacket, pants, and shoes and provided with instruction on compensatory strategies to perform ADLs. Patient to follow up with MD for further therapy needs.       Recommendations for follow up therapy are one component of a multi-disciplinary discharge planning process, led by the attending physician.  Recommendations may be updated based on patient status, additional functional criteria and insurance authorization.   Follow Up Recommendations  No OT follow up    Assistance Recommended at Discharge PRN  Patient can return home with the following A little help with bathing/dressing/bathroom;Assistance with cooking/housework;Assist for transportation    Functional Status Assessment  Patient has had a recent decline in their functional status and demonstrates the ability to make significant improvements in function in a reasonable and predictable amount of time.  Equipment Recommendations  None recommended by OT    Recommendations for Other Services       Precautions / Restrictions Precautions Precautions: Shoulder Type of Shoulder Precautions: Ok to use operative arm to assist in feeding, bathing, ADL's. PROM  restrictions: for use in hygiene and ADL only   ER 20   ABD 45   FE 60 Shoulder Interventions: Shoulder sling/immobilizer Precaution Booklet Issued: Yes (comment) Restrictions Weight Bearing Restrictions: Yes RUE Weight Bearing: Non weight bearing      Mobility Bed Mobility                    Transfers Overall transfer level: Needs assistance   Transfers: Sit to/from Stand Sit to Stand: Min guard                  Balance Overall balance assessment: No apparent balance deficits (not formally assessed)                                         ADL either performed or assessed with clinical judgement   ADL                                               Vision Baseline Vision/History: 1 Wears glasses       Perception     Praxis      Pertinent Vitals/Pain Pain Assessment Pain Assessment: Faces Faces Pain Scale: No hurt     Hand Dominance     Extremity/Trunk Assessment Upper Extremity Assessment Upper Extremity Assessment: RUE deficits/detail;LUE deficits/detail RUE Deficits / Details: motor control and sensation impaired secondary to block LUE Deficits / Details: WFL-ROM           Communication     Cognition Arousal/Alertness: Awake/alert Behavior During Therapy: WFL for tasks assessed/performed Overall Cognitive Status: Within  Functional Limits for tasks assessed                                       General Comments       Exercises     Shoulder Instructions Shoulder Instructions Donning/doffing shirt without moving shoulder: Independent Method for sponge bathing under operated UE: Independent Donning/doffing sling/immobilizer: Independent Correct positioning of sling/immobilizer: Independent Pendulum exercises (written home exercise program): Independent ROM for elbow, wrist and digits of operated UE: Independent Sling wearing schedule (on at all times/off for ADL's):  Independent [place] positioning of operated UE when showering: Independent Positioning of UE while sleeping: [place] expects to be discharged to:: Private residence Living Arrangements: Spouse/significant other Available Help at Discharge: Family Type of Home: House                                  Prior Functioning/Environment                          OT Problem List:        OT Treatment/Interventions:      OT Goals(Current goals can be found in the care plan section) Acute Rehab OT Goals OT Goal Formulation: All assessment and education complete, DC therapy  OT Frequency:      Co-evaluation              AM-PAC OT "6 Clicks" Daily Activity     Outcome Measure Help from another person eating meals?: None Help from another person taking care of personal grooming?: None Help from another person toileting, which includes using toliet, bedpan, or urinal?: None Help from another person bathing (including washing, rinsing, drying)?: A Little Help from another person to put on and taking off regular upper body clothing?: A Little Help from another person to put on and taking off regular lower body clothing?: A Little 6 Click Score: 21   End of Session Nurse Communication: Mobility status (patient is cleared from OT)  Activity Tolerance: Patient tolerated treatment well Patient left: in chair;with family/visitor present  OT Visit Diagnosis: Muscle weakness (generalized) (M62.81)                Time: 7209-4709 OT Time Calculation (min): 20 min Charges:  OT General Charges $OT Visit: 1 Visit OT Evaluation $OT Eval Low Complexity: [place], OTS Acute rehab services   Charlann Lange 12/29/2021, 4:27 PM

## 2021-12-29 NOTE — Discharge Instructions (Signed)
? ?John Thomas, M.D., F.A.A.O.S. ?Orthopaedic Surgery ?Specializing in Arthroscopic and Reconstructive ?Surgery of the Shoulder ?[phone] ?[address] - [place], [place] [address] - Fax [phone] ? ? ?POST-OP TOTAL SHOULDER REPLACEMENT INSTRUCTIONS ? ?1. Follow up in the office for your first post-op appointment 10-14 days from the date of your surgery. If you do not already have a scheduled appointment, our office will contact you to schedule. ? ?2. The bandage over your incision is waterproof. You may begin showering with this dressing on. You may leave this dressing on until first follow up appointment within 2 weeks. We prefer you leave this dressing in place until follow up however after 5-7 days if you are having itching or skin irritation and would like to remove it you may do so. Go slow and tug at the borders gently to break the bond the dressing has with the skin. At this point if there is no drainage it is okay to go without a bandage or you may cover it with a light guaze and tape. You can also expect significant bruising around your shoulder that will drift down your arm and into your chest wall. This is very normal and should resolve over several days. ? ? 3. Wear your sling/immobilizer at all times except to perform the exercises below or to occasionally let your arm dangle by your side to stretch your elbow. You also need to sleep in your sling immobilizer until instructed otherwise. It is ok to remove your sling if you are sitting in a controlled environment and allow your arm to rest in a position of comfort by your side or on your lap with pillows to give your neck and skin a break from the sling. You may remove it to allow arm to dangle by side to shower. If you are up walking around and when you go to sleep at night you need to wear it. ? ?4. Range of motion to your elbow, wrist, and hand are encouraged 3-5 times daily. Exercise to your hand and fingers helps to reduce  swelling you may experience. ? ? ?5. Prescriptions for a pain medication and a muscle relaxant are provided for you. It is recommended that if you are experiencing pain that you pain medication alone is not controlling, add the muscle relaxant along with the pain medication which can give additional pain relief. The first 1-2 days is generally the most severe of your pain and then should gradually decrease. As your pain lessens it is recommended that you decrease your use of the pain medications to an "as needed basis'" only and to always comply with the recommended dosages of the pain medications. ? ?6. Pain medications can produce constipation along with their use. If you experience this, the use of an over the counter stool softener or laxative daily is recommended.  ? ?7. For additional questions or concerns, please do not hesitate to call the office. If after hours there is an answering service to forward your concerns to the physician on call. ? ?8.Pain control following an exparel block ? ?To help control your post-operative pain you received a nerve block  performed with Exparel which is a long acting anesthetic (numbing agent) which can provide pain relief and sensations of numbness (and relief of pain) in the operative shoulder and arm for up to 3 days. Sometimes it provides mixed relief, meaning you may still have numbness in certain areas of the arm but can still be able to   move  parts of that arm, hand, and fingers. We recommend that your prescribed pain medications  be used as needed. We do not feel it is necessary to "pre medicate" and "stay ahead" of pain.  Taking narcotic pain medications when you are not having any pain can lead to unnecessary and potentially dangerous side effects.   ? ?9. Use the ice machine as much as possible in the first 5-7 days from surgery, then you can wean its use to as needed. The ice typically needs to be replaced every 6 hours, instead of ice you can actually freeze  water bottles to put in the cooler and then fill water around them to avoid having to purchase ice. You can have spare water bottles freezing to allow you to rotate them once they have melted. Try to have a thin shirt or light cloth or towel under the ice wrap to protect your skin.  ? ?FOR ADDITIONAL INFO ON ICE MACHINE AND INSTRUCTIONS GO TO THE WEBSITE AT ? ?https://www.[employer].com/products/donjoy/donjoy-iceman-classic3 ? ?10.  We recommend that you avoid any dental work or cleaning in the first 3 months following your joint replacement. This is to help minimize the possibility of infection from the bacteria in your mouth that enters your bloodstream during dental work. We also recommend that you take an antibiotic prior to your dental work for the first year after your shoulder replacement to further help reduce that risk. Please simply contact our office for antibiotics to be sent to your pharmacy prior to dental work. ? ?11. Dental Antibiotics: ? ?We recommend waiting at least 3 months for any dental work even cleanings unless there is a dental emergency. We also recommend  prophylactic antibiotics for all dental procdeures  the first year following your joint replacement. In some exceptions we recommend them to be used lifelong. We will provide you with that prescription in follow up office visits, or you can call our office. ? ?Exceptions are as follows: ? ?1. History of prior total joint infection ? ?2. Severely immunocompromised (Organ Transplant, cancer chemotherapy, Rheumatoid biologic ?meds such as Humera) ? ?3. Poorly controlled diabetes (A1C &gt; 8.0, blood glucose over 200) ? ? ?POST-OP EXERCISES ? ?Pendulum Exercises ? ?Perform pendulum exercises while standing and bending at the waist. Support your uninvolved arm on a table or chair and allow your operated arm to hang freely. Make sure to do these exercises passively - not using you shoulder muscles. These exercises can be performed once your  nerve block effects have worn off. ? ?Repeat 20 times. Do 3 sessions per day. ? ? ?  ?

## 2021-12-29 NOTE — Transfer of Care (Signed)
Immediate Anesthesia Transfer of Care Note  Patient: Carlester Kasparek  Procedure(s) Performed: TOTAL SHOULDER ARTHROPLASTY (Right: Shoulder)  Patient Location: PACU  Anesthesia Type:General  Level of Consciousness: awake, alert  and oriented  Airway & Oxygen Therapy: Patient Spontanous Breathing and Patient connected to face mask oxygen  Post-op Assessment: Report given to RN and Post -op Vital signs reviewed and stable  Post vital signs: Reviewed and stable  Last Vitals:  Vitals Value Taken Time  BP    Temp    Pulse 49 12/29/21 1335  Resp 15 12/29/21 1335  SpO2 99 % 12/29/21 1335  Vitals shown include unvalidated device data.  Last Pain:  Vitals:   12/29/21 1030  PainSc: 0-No pain         Complications: No notable events documented.

## 2021-12-29 NOTE — H&P (Signed)
John Thomas    Chief Complaint: Right shoulder osteoarthritis HPI: The patient is a 65 y.o. male with chronic and progressively increasing right shoulder pain related to glenohumeral osteoarthritis.  Patient does have a remote history of rotator cuff repair with recent MRI scan confirming the rotator cuff to be intact.  Due to his increasing functional imitations and failure to respond to prolonged attempts at conservative management, he is brought to the operating room this time for planned right shoulder anatomic arthroplasty  Past Medical History:  Diagnosis Date   Arthritis    Bradycardia    Hyperlipidemia    MGUS (monoclonal gammopathy of unknown significance)    Pneumonia    Skin cancer    Head      Past Surgical History:  Procedure Laterality Date   CHOLECYSTECTOMY     MOHS SURGERY     Head   NASAL SEPTUM SURGERY     ROTATOR CUFF REPAIR Right    WISDOM TOOTH EXTRACTION      History reviewed. No pertinent family history.  Social History:  reports that he has never smoked. He has never used smokeless tobacco. He reports current alcohol use of about 4.0 standard drinks of alcohol per week. He reports that he does not use drugs.  BMI: Estimated body mass index is 27.56 kg/John as calculated from the following:   Height as of this encounter: '6\' 3"'$  (1.905 John).   Weight as of this encounter: 100 kg.  No results found for: "ALBUMIN" Diabetes: Patient does not have a diagnosis of diabetes.     Smoking Status:   reports that he has never smoked. He has never used smokeless tobacco.     Medications Prior to Admission  Medication Sig Dispense Refill   acidophilus (RISAQUAD) CAPS capsule Take 1 capsule by mouth daily.     cholecalciferol (VITAMIN D3) 25 MCG (1000 UNIT) tablet Take 1,000 Units by mouth daily.     cyanocobalamin (VITAMIN B12) 1000 MCG tablet Take 1,000 mcg by mouth daily.     diclofenac sodium (VOLTAREN) 1 % GEL Apply 2 g topically daily as needed (pain).   6   gabapentin (NEURONTIN) 300 MG capsule Take 600-900 mg by mouth See admin instructions. Take 600 mg by mouth in the morning and 900 mg at bedtime  10   Multiple Vitamin (MULTIVITAMIN WITH MINERALS) TABS tablet Take 1 tablet by mouth daily.     naproxen sodium (ALEVE) 220 MG tablet Take 220 mg by mouth daily as needed (pain).     rosuvastatin (CRESTOR) 10 MG tablet Take 10 mg by mouth daily.     traZODone (DESYREL) 150 MG tablet Take 50-100 mg by mouth at bedtime as needed for sleep.     hydroxypropyl methylcellulose / hypromellose (ISOPTO TEARS / GONIOVISC) 2.5 % ophthalmic solution Place 1 drop into both eyes as needed for dry eyes.     nitroGLYCERIN (NITRODUR - DOSED IN MG/24 HR) 0.2 mg/hr patch UNWRAP AND APPLY 1/2 PATCH TO AFFECTED ACHILLES, CHANGE DAILY (Patient not taking: Reported on 12/21/2021) 90 patch 0   pramipexole (MIRAPEX) 0.125 MG tablet Take 0.125 mg by mouth daily as needed (restless leg).  0     Physical Exam: Right shoulder demonstrates functional motion but significant pain throughout his arc of motion.  He maintains excellent strength to manual muscle testing.  He is neurovascular intact in the right upper extremity.  Radiographs  Plain films of the right shoulder confirm mild diffuse degenerative changes.  Recent MRI  scan shows the dominant finding to be significant degenerative chondrosis with subchondral edema at both the humeral head and the glenoid.  Vitals  Pulse Rate:  [43-46] 43 (10/19 1010) Resp:  [10-13] 10 (10/19 1010) BP: (130-157)/(57-58) 130/58 (10/19 1010) SpO2:  [94 %-99 %] 98 % (10/19 1010) Weight:  [100 kg] 100 kg (10/19 0939)  Assessment/Plan  Impression: Right shoulder osteoarthritis  Plan of Action: Procedure(s): TOTAL SHOULDER ARTHROPLASTY  John Thomas John Thomas 12/29/2021, 10:16 AM Contact # [phone]

## 2021-12-29 NOTE — Anesthesia Procedure Notes (Signed)
Procedure Name: Intubation Date/Time: 12/29/2021 11:31 AM  Performed by: Maxwell Caul, CRNAPre-anesthesia Checklist: Patient identified, Emergency Drugs available, Suction available and Patient being monitored Patient Re-evaluated:Patient Re-evaluated prior to induction Oxygen Delivery Method: Circle system utilized Preoxygenation: Pre-oxygenation with 100% oxygen Induction Type: IV induction Ventilation: Mask ventilation without difficulty Laryngoscope Size: Mac and 4 Grade View: Grade I Tube type: Oral Tube size: 7.5 mm Number of attempts: 1 Airway Equipment and Method: Stylet Placement Confirmation: ETT inserted through vocal cords under direct vision, positive ETCO2 and breath sounds checked- equal and bilateral Secured at: 22 cm Tube secured with: Tape Dental Injury: Teeth and Oropharynx as per pre-operative assessment

## 2021-12-29 NOTE — Op Note (Signed)
12/29/2021  1:19 PM  PATIENT:   John Thomas  66 y.o. male  PRE-OPERATIVE DIAGNOSIS:  Right shoulder osteoarthritis  POST-OPERATIVE DIAGNOSIS: Same  PROCEDURE: Right shoulder stemless anatomic arthroplasty utilizing a size 45 Arthrex trunnion with a large cage screw, 45 x 17 humeral head, medium glenoid  SURGEON:  Alezander Dimaano, Metta Clines M.D.  ASSISTANTS: Jenetta Loges, PA-C  Jenetta Loges, PA-C was utilized as an Environmental consultant throughout this case, essential for help with positioning the patient, positioning extremity, tissue manipulation, implantation of the prosthesis, suture management, wound closure, and intraoperative decision-making.  ANESTHESIA:   General endotracheal and interscalene block with Exparel  EBL: 150 cc  SPECIMEN: None  Drains: None   PATIENT DISPOSITION:  PACU - hemodynamically stable.    PLAN OF CARE: Discharge to home after PACU  Brief history:  Patient is a 66 year old gentleman who has had chronic and progressive increasing right shoulder pain related to progressive osteoarthritis.  Does have a remote history of rotator cuff repair with recent MRI scan showing the rotator cuff to be intact.  His examination demonstrates excellent motion as well as strength but significant pain.  Radiographs and MRI scan confirm degenerative chondrosis of the glenohumeral joint.  Due to his increasing functional imitations and failure to respond to prolonged attempts at conservative management, he is brought to the operating this time for planned right shoulder anatomic arthroplasty.  Preoperatively, I counseled the patient regarding treatment options and risks versus benefits thereof.  Possible surgical complications were all reviewed including potential for bleeding, infection, neurovascular injury, persistent pain, loss of motion, anesthetic complication, failure of the implant, and possible need for additional surgery. They understand and accept and agrees with our planned  procedure.   Procedure detail:  After undergoing routine preop evaluation the patient received prophylactic antibiotics and interscalene block with Exparel established in the holding area by the anesthesia department.  Subsequently placed spine on the operating table and underwent the smooth induction of a general endotracheal anesthesia.  Placed into the beachchair position and appropriately padded and protected.  The right shoulder girdle region was sterilely prepped and draped in standard fashion.  Timeout was called.  A deltopectoral approach to the right shoulder was made to an approximately 10 cm incision.  Skin flaps were elevated dissection carried deep in the deltopectoral interval was developed from proximal to distal with the vein taken laterally.  The upper centimeter of the pectoralis major tendon was then tenotomized for exposure and the long head biceps tendon was then tenodesed at the upper border the pectoralis major tendon with the proximal segment unroofed and excised.  The rotator cuff was then split from the apex of the bicipital groove to the base of the coracoid and the insertion of the subscapularis was then identified at the lesser tuberosity and an oscillating saw was then used to perform a lesser tuberosity osteotomy removing a thin wafer of bone.  Subscapularis was then tagged mobilized and reflected medially capsular attachments were then divided from the anterior and inferior margins of the humeral neck allowing deliver the humeral head through the wound.  An extra medullary guide was then used to outline a proposed humeral head resection which we performed with an oscillating saw at approximately 30 degrees retroversion.  The humeral metaphysis was then sized at a 45 trunnion.  The metaphysis was prepared with the coring reamer and then measured for a large cage screw.  This point a metal cap was then placed over the cut proximal humeral surface  and we then exposed the glenoid  with appropriate retractors.  A circumferential labral resection was performed gaining complete visualization of the periphery of the glenoid.  It was measured at a medium glenoid and was then reamed with a medium reamer to a stable subchondral bony bed.  Preparation completed by placing our central drill hole followed by the placement of the superior and inferior peg and slot respectively and the glenoid was then broached and the trial showed excellent fit and fixation.  At this point all debris was then carefully irrigated free from the glenoid vault.  Cement was mixed introduced into the superior and inferior peg and slot respectively and bone graft was impacted about the central peg and her glenoid was then impacted with excellent fit and fixation.  Attention returned to the metaphysis where the bone quality was confirmed good for the stemless application.  The center guide was placed and a 45 trunnion was then seated with a suture limb placed to the eyelet on the collar of the trunnion.  The large cage screw was then packed with bone and inserted achieving excellent purchase and fixation.  We then performed a series of trial reductions and ultimately found the 45 x 17 head gave is the best motion stability and soft tissue balance with approximately 40% translation of the humeral head over the glenoid posteriorly.  This point the trial was then removed.  We placed 2 additional medial row anchors into the humeral metaphysis for ultimate repair of the subscapularis.  Our implant was then cleaned and dried and the final 45 x 17 head was then impacted.  A final reduction was performed which again showed excellent motion stability and soft tissue balance all much to our satisfaction.  At this point we then mobilized the subscapularis and confirmed that it had good elasticity.  We placed a convergence stitch at the upper border of the subscapularis with the junction to the anterior supraspinatus and with the proper  alignment confirmed our medial row suture limbs were then passed to the bone tendon junction of the LTO.  These suture limbs were then passed in an alternating fashion into 2 lateral anchors placed in the bicipital groove achieving excellent purchase and fixation.  We then closed the rotator interval with a series of figure-of-eight suture tape sutures.  At this point irrigation was completed.  Hemostasis was obtained.  The arm easily achieved 45 degrees of external rotation without excessive tension on the subscap repair.  The deltopectoral interval was repaired with a series of figure-of-eight number Vicryl sutures after we placed vancomycin powder liberally deep within the wound throughout all soft tissue planes.  Subcu layer was closed with 2-0 Monocryl and intracuticular 3-0 Monocryl used to close the skin followed by Dermabond and Aquacel dressing.  The right arm was then placed into a sling and the patient was awakened, extubated, and taken to the recovery room in stable condition.   Marin Shutter MD  Contact # [phone]

## 2021-12-29 NOTE — Anesthesia Procedure Notes (Addendum)
  Anesthesia Regional Block: Interscalene brachial plexus block   Pre-Anesthetic Checklist: , timeout performed,  Correct Patient, Correct Site, Correct Laterality,  Correct Procedure, Correct Position, site marked,  Risks and benefits discussed,  Surgical consent,  Pre-op evaluation,  At surgeon's request and post-op pain management  Laterality: Right  Prep: chloraprep       Needles:  Injection technique: Single-shot  Needle Type: Echogenic Stimulator Needle     Needle Length: 5cm  Needle Gauge: 22     Additional Needles:   Procedures:, nerve stimulator,,, ultrasound used (permanent image in chart),,     Nerve Stimulator or Paresthesia:  Response: hand, 0.45 mA  Additional Responses:   Narrative:  Start time: 12/29/2021 10:05 AM End time: 12/29/2021 10:10 AM Injection made incrementally with aspirations every 5 mL.  Performed by: Personally  Anesthesiologist: Janeece Riggers, MD  Additional Notes: Functioning IV was confirmed and monitors were applied.  A 58m 22ga Arrow echogenic stimulator needle was used. Sterile prep and drape,hand hygiene and sterile gloves were used. Ultrasound guidance: relevant anatomy identified, needle position confirmed, local anesthetic spread visualized around nerve(s)., vascular puncture avoided.  Image printed for medical record. Negative aspiration and negative test dose prior to incremental administration of local anesthetic. The patient tolerated the procedure well.

## 2021-12-29 NOTE — Anesthesia Postprocedure Evaluation (Signed)
Anesthesia Post Note  Patient: John Thomas  Procedure(s) Performed: TOTAL SHOULDER ARTHROPLASTY (Right: Shoulder)     Patient location during evaluation: PACU Anesthesia Type: Regional and General Level of consciousness: awake and alert Pain management: pain level controlled Vital Signs Assessment: post-procedure vital signs reviewed and stable Respiratory status: spontaneous breathing, nonlabored ventilation, respiratory function stable and patient connected to nasal cannula oxygen Cardiovascular status: blood pressure returned to baseline and stable Postop Assessment: no apparent nausea or vomiting Anesthetic complications: no   No notable events documented.  Last Vitals:  Vitals:   12/29/21 1345 12/29/21 1400  BP: (!) 155/57 134/68  Pulse: (!) 48 (!) 43  Resp: 18 12  Temp:    SpO2: 100% 100%    Last Pain:  Vitals:   12/29/21 1400  PainSc: 4                  Abayomi Pattison

## 2021-12-29 NOTE — Anesthesia Preprocedure Evaluation (Addendum)
Anesthesia Evaluation  Patient identified by MRN, date of birth, ID band Patient awake    Reviewed: Allergy & Precautions, H&P , NPO status , Patient's Chart, lab work & pertinent test results  Airway Mallampati: II  TM Distance: >3 FB Neck ROM: Full    Dental no notable dental hx. (+) Teeth Intact, Dental Advisory Given   Pulmonary neg pulmonary ROS,    Pulmonary exam normal breath sounds clear to auscultation       Cardiovascular Exercise Tolerance: Good negative cardio ROS Normal cardiovascular exam Rhythm:Regular Rate:Normal     Neuro/Psych negative neurological ROS  negative psych ROS   GI/Hepatic negative GI ROS, Neg liver ROS,   Endo/Other  negative endocrine ROS  Renal/GU negative Renal ROS  negative genitourinary   Musculoskeletal negative musculoskeletal ROS (+) Arthritis , Osteoarthritis,    Abdominal   Peds negative pediatric ROS (+)  Hematology negative hematology ROS (+) MGUS (monoclonal gammopathy of unknown significance)   Anesthesia Other Findings   Reproductive/Obstetrics negative OB ROS                            Anesthesia Physical Anesthesia Plan  ASA: 2  Anesthesia Plan: General and Regional   Post-op Pain Management: Regional block* and Minimal or no pain anticipated   Induction: Intravenous  PONV Risk Score and Plan: 2  Airway Management Planned: Oral ETT  Additional Equipment: None  Intra-op Plan:   Post-operative Plan: Extubation in OR  Informed Consent: I have reviewed the patients History and Physical, chart, labs and discussed the procedure including the risks, benefits and alternatives for the proposed anesthesia with the patient or authorized representative who has indicated his/her understanding and acceptance.       Plan Discussed with: Anesthesiologist and CRNA  Anesthesia Plan Comments: (  )        Anesthesia Quick  Evaluation

## 2022-01-03 ENCOUNTER — Encounter (HOSPITAL_COMMUNITY): Payer: Self-pay | Admitting: Orthopedic Surgery
